# Patient Record
Sex: Female | Born: 1935 | Race: White | Hispanic: No | Marital: Married | Smoking: Former smoker
Health system: Southern US, Community
[De-identification: ages and names within clinical notes are randomized; demographics above are authoritative.]

## PROBLEM LIST (undated history)

## (undated) DIAGNOSIS — H409 Unspecified glaucoma: Secondary | ICD-10-CM

## (undated) DIAGNOSIS — J449 Chronic obstructive pulmonary disease, unspecified: Secondary | ICD-10-CM

## (undated) DIAGNOSIS — G2581 Restless legs syndrome: Secondary | ICD-10-CM

## (undated) HISTORY — PX: APPENDECTOMY: SHX54

## (undated) HISTORY — PX: PNEUMONECTOMY: SHX168

## (undated) HISTORY — PX: TONSILLECTOMY: SUR1361

---

## 2004-05-20 ENCOUNTER — Inpatient Hospital Stay: Payer: Self-pay | Admitting: Internal Medicine

## 2004-05-20 ENCOUNTER — Other Ambulatory Visit: Payer: Self-pay

## 2004-09-26 ENCOUNTER — Ambulatory Visit: Payer: Self-pay | Admitting: Internal Medicine

## 2005-08-15 ENCOUNTER — Emergency Department: Payer: Self-pay | Admitting: Unknown Physician Specialty

## 2005-08-15 ENCOUNTER — Other Ambulatory Visit: Payer: Self-pay

## 2005-10-14 ENCOUNTER — Ambulatory Visit: Payer: Self-pay | Admitting: Internal Medicine

## 2006-10-20 ENCOUNTER — Ambulatory Visit: Payer: Self-pay | Admitting: Internal Medicine

## 2007-10-22 ENCOUNTER — Ambulatory Visit: Payer: Self-pay | Admitting: Internal Medicine

## 2008-10-25 ENCOUNTER — Ambulatory Visit: Payer: Self-pay | Admitting: Internal Medicine

## 2009-10-26 ENCOUNTER — Ambulatory Visit: Payer: Self-pay | Admitting: Internal Medicine

## 2010-01-02 ENCOUNTER — Encounter: Admission: RE | Admit: 2010-01-02 | Discharge: 2010-01-02 | Payer: Self-pay | Admitting: Orthopedic Surgery

## 2010-01-18 ENCOUNTER — Encounter: Admission: RE | Admit: 2010-01-18 | Discharge: 2010-01-18 | Payer: Self-pay | Admitting: Orthopedic Surgery

## 2010-03-01 ENCOUNTER — Encounter
Admission: RE | Admit: 2010-03-01 | Discharge: 2010-03-01 | Payer: Self-pay | Source: Home / Self Care | Attending: Orthopedic Surgery | Admitting: Orthopedic Surgery

## 2010-06-05 ENCOUNTER — Inpatient Hospital Stay: Payer: Self-pay

## 2010-10-29 ENCOUNTER — Ambulatory Visit: Payer: Self-pay | Admitting: Internal Medicine

## 2010-11-06 ENCOUNTER — Ambulatory Visit: Payer: Self-pay | Admitting: Internal Medicine

## 2011-06-04 ENCOUNTER — Encounter: Payer: Self-pay | Admitting: Internal Medicine

## 2011-06-17 ENCOUNTER — Encounter: Payer: Self-pay | Admitting: Internal Medicine

## 2011-07-17 ENCOUNTER — Encounter: Payer: Self-pay | Admitting: Internal Medicine

## 2011-08-17 ENCOUNTER — Encounter: Payer: Self-pay | Admitting: Internal Medicine

## 2011-09-16 ENCOUNTER — Encounter: Payer: Self-pay | Admitting: Internal Medicine

## 2011-11-04 ENCOUNTER — Ambulatory Visit: Payer: Self-pay | Admitting: Internal Medicine

## 2011-11-05 ENCOUNTER — Ambulatory Visit: Payer: Self-pay | Admitting: Internal Medicine

## 2012-11-10 ENCOUNTER — Ambulatory Visit: Payer: Self-pay

## 2013-11-18 ENCOUNTER — Ambulatory Visit: Payer: Self-pay

## 2014-07-04 ENCOUNTER — Ambulatory Visit
Admit: 2014-07-04 | Disposition: A | Discharge status: Home / Self Care | Payer: Self-pay | Attending: Internal Medicine | Admitting: Internal Medicine

## 2014-11-26 ENCOUNTER — Emergency Department: Payer: Medicare Other

## 2014-11-26 ENCOUNTER — Emergency Department
Admission: EM | Admit: 2014-11-26 | Discharge: 2014-11-26 | Disposition: A | Discharge status: Home / Self Care | Payer: Medicare Other | Attending: Emergency Medicine | Admitting: Emergency Medicine

## 2014-11-26 ENCOUNTER — Encounter: Payer: Self-pay | Admitting: *Deleted

## 2014-11-26 DIAGNOSIS — S80211A Abrasion, right knee, initial encounter: Secondary | ICD-10-CM | POA: Diagnosis not present

## 2014-11-26 DIAGNOSIS — S50312A Abrasion of left elbow, initial encounter: Secondary | ICD-10-CM | POA: Diagnosis not present

## 2014-11-26 DIAGNOSIS — S0083XA Contusion of other part of head, initial encounter: Secondary | ICD-10-CM | POA: Diagnosis not present

## 2014-11-26 DIAGNOSIS — Y9389 Activity, other specified: Secondary | ICD-10-CM | POA: Insufficient documentation

## 2014-11-26 DIAGNOSIS — Y998 Other external cause status: Secondary | ICD-10-CM | POA: Insufficient documentation

## 2014-11-26 DIAGNOSIS — Y9289 Other specified places as the place of occurrence of the external cause: Secondary | ICD-10-CM | POA: Diagnosis not present

## 2014-11-26 DIAGNOSIS — S6992XA Unspecified injury of left wrist, hand and finger(s), initial encounter: Secondary | ICD-10-CM | POA: Diagnosis present

## 2014-11-26 DIAGNOSIS — Z87891 Personal history of nicotine dependence: Secondary | ICD-10-CM | POA: Diagnosis not present

## 2014-11-26 DIAGNOSIS — S0990XA Unspecified injury of head, initial encounter: Secondary | ICD-10-CM | POA: Insufficient documentation

## 2014-11-26 DIAGNOSIS — W010XXA Fall on same level from slipping, tripping and stumbling without subsequent striking against object, initial encounter: Secondary | ICD-10-CM | POA: Diagnosis not present

## 2014-11-26 DIAGNOSIS — S52592A Other fractures of lower end of left radius, initial encounter for closed fracture: Secondary | ICD-10-CM | POA: Insufficient documentation

## 2014-11-26 DIAGNOSIS — S52502A Unspecified fracture of the lower end of left radius, initial encounter for closed fracture: Secondary | ICD-10-CM

## 2014-11-26 HISTORY — DX: Chronic obstructive pulmonary disease, unspecified: J44.9

## 2014-11-26 HISTORY — DX: Restless legs syndrome: G25.81

## 2014-11-26 MED ORDER — TRAMADOL HCL 50 MG PO TABS
50.0000 mg | ORAL_TABLET | Freq: Once | ORAL | Status: AC
  Administered 2014-11-26: 50 mg via ORAL
  Filled 2014-11-26: qty 1

## 2014-11-26 MED ORDER — TRAMADOL HCL 50 MG PO TABS: 50.0000 mg | ORAL_TABLET | Freq: Four times a day (QID) | ORAL | Status: AC | PRN

## 2014-11-26 MED ORDER — ACETAMINOPHEN 325 MG PO TABS
650.0000 mg | ORAL_TABLET | Freq: Once | ORAL | Status: AC
  Administered 2014-11-26: 650 mg via ORAL
  Filled 2014-11-26: qty 2

## 2014-11-26 NOTE — ED Provider Notes (Signed)
[facility] Emergency Department Provider Note  Time seen: 1:34 PM  I have reviewed the triage vital signs and the nursing notes.   HISTORY  Chief Complaint Fall    HPI Josie Burleigh is a 81 y.o. female with a past medical history of COPD on 2 L of oxygen at home presents the emergency department after a fall. According to the patient she was getting up when she tripped on the oxygen line causing her to fall forward's. Patient states she caught her self on her hands but her head did hit the ground. Denies loss of consciousness. Denies being on any blood thinners. Patient's only pain complaint are mild left forehead pain, and moderate left wrist pain. Wrist pain is much worse with movement.    Past Medical History  Diagnosis Date  . COPD (chronic obstructive pulmonary disease)   . Restless legs     There are no active problems to display for this patient.   Past Surgical History  Procedure Laterality Date  . Lung removal, partial Right     squamous cell carcinoma, all lobes on the right were removed.  Marland Kitchen Appendectomy    . Tonsillectomy      No current outpatient prescriptions on file.  Allergies Review of patient's allergies indicates no known allergies.  No family history on file.  Social History Social History  Substance Use Topics  . Smoking status: Former Research scientist (life sciences)  . Smokeless tobacco: None  . Alcohol Use: Yes     Comment: occasionally    Review of Systems Constitutional: Negative for fever negative for LOC. Cardiovascular: Negative for chest pain. Respiratory: Negative for shortness of breath. Gastrointestinal: Negative for abdominal pain Musculoskeletal: Positive for left forehead pain. Positive for left wrist pain. Skin: Abrasions/small skin tear to right knee and left elbow Neurological: No headache. Negative for focal weakness or numbness. 10-point ROS otherwise  negative.  ____________________________________________   PHYSICAL EXAM:  VITAL SIGNS: ED Triage Vitals  Enc Vitals Group     BP 11/26/14 1324 191/93 mmHg     Pulse Rate 11/26/14 1324 110     Resp 11/26/14 1324 20     Temp 11/26/14 1324 98.5 F (36.9 C)     Temp Source 11/26/14 1324 Oral     SpO2 11/26/14 1324 92 %     Weight 11/26/14 1324 148 lb 8 oz (67.359 kg)     Height 11/26/14 1324 '5\' 4"'$  (1.626 m)     Head Cir --      Peak Flow --      Pain Score 11/26/14 1325 8     Pain Loc --      Pain Edu? --      Excl. in [place]? --     Constitutional: Alert and oriented. Well appearing and in no distress. Eyes: Normal exam ENT   Head: Moderate hematoma to left forehead.   Nose: No congestion/rhinnorhea.   Mouth/Throat: Mucous membranes are moist. No oral injuries. Cardiovascular: Normal rate, regular rhythm.  Respiratory: Normal respiratory effort without tachypnea nor retractions. Breath sounds are clear  Gastrointestinal: Soft and nontender. No distention.  Musculoskeletal: Moderate tenderness of left wrist, worse with movement. Neurovascularly intact distally. Pelvis stable, good range of motion bilateral hips without tenderness. Neurologic:  Normal speech and language. No gross focal neurologic deficits Skin:  Small skin tear to patient's right knee and left elbow, no repair required. Psychiatric: Mood and affect are normal. Speech and behavior are normal.   ____________________________________________  RADIOLOGY  CT head negative X-ray consistent with left distal radius compaction fracture  ____________________________________________    INITIAL IMPRESSION / ASSESSMENT AND PLAN / ED COURSE  Pertinent labs & imaging results that were available during my care of the patient were reviewed by me and considered in my medical decision making (see chart for details).  Patient presents after a fall. X-ray consistent with left distal radius fracture. CT head  negative. We will splint the left wrist in a volar wrist splint, and have the patient follow-up with a pH. We'll discharge with Ultram which the patient has taken in the past with good response.  ____________________________________________   FINAL CLINICAL IMPRESSION(S) / ED DIAGNOSES  Distal radius fracture Cheral Marker, MD 11/26/14 [id number]

## 2014-11-26 NOTE — ED Notes (Signed)
NAD noted at time of D/C. Pt taken to lobby via wheelchair. Pt denies comments/concerns at this time.

## 2014-11-26 NOTE — Discharge Instructions (Signed)
Wrist Fracture A wrist fracture is a break or crack in one of the bones of your wrist. Your wrist is made up of eight small bones at the palm of your hand (carpal bones) and two long bones that make up your forearm (radius and ulna). The goal of treatment is to hold the injured bone in place while it heals. Surgery may or may not be needed to care for your injured wrist.  HOME CARE  Keep your injured wrist raised (elevated). Move your fingers as much as you can.  Do not put pressure on any part of your cast or splint. It may break.  Use a plastic bag to protect your cast or splint from water while bathing or showering. Do not lower your cast or splint into water.  Take medicines only as told by your doctor.  Keep your cast or splint clean and dry. If it gets wet, damaged, or suddenly feels too tight, tell your doctor right away.  Do not use any tobacco products including cigarettes, chewing tobacco, or electronic cigarettes. Tobacco can slow bone healing. If you need help quitting, ask your doctor.  Keep all follow-up visits as told by your doctor. This is important.  Ask your doctor if you should take supplements of calcium and vitamins C and D. GET HELP IF:   Your cast or splint is damaged, breaks, or gets wet.  You have a fever.  You have chills.  You have very bad pain that does not go away.  You have more swelling (inflammation) than before the cast was put on. GET HELP RIGHT AWAY IF:   Your hand or fingernails on the injured arm turn blue or gray, or feel cold or numb.  You lose some feeling in the fingers of your injured arm. MAKE SURE YOU:   Understand these instructions.  Will watch your condition.  Will get help right away if you are not doing well or get worse. Document Released: 08/21/2007 Document Revised: 07/19/2013 Document Reviewed: 09/16/2011 [facility] Patient Information 2015 [place], [place]. This information is not intended to replace advice given to you  by your health care provider. Make sure you discuss any questions you have with your health care provider.

## 2014-11-26 NOTE — ED Notes (Signed)
Per EMS and patient's report, patient tripped over O2 tubing on O2 concentrator. This was unwitnessed, though husband heard the fall and came to her. Patient denies LOC, denies being on blood thinners. Patient c/o pain in left wrist. Patient has hematoma over left eye, skin tear on right knee and left elbow. Left wrist is more painful with movement. Patient is calm, pleasant. Patient lives at [facility] with her husband.

## 2014-11-28 ENCOUNTER — Emergency Department
Admission: EM | Admit: 2014-11-28 | Discharge: 2014-11-28 | Disposition: A | Discharge status: Home / Self Care | Payer: Medicare Other | Attending: Emergency Medicine | Admitting: Emergency Medicine

## 2014-11-28 ENCOUNTER — Encounter: Payer: Self-pay | Admitting: Emergency Medicine

## 2014-11-28 DIAGNOSIS — Y998 Other external cause status: Secondary | ICD-10-CM | POA: Diagnosis not present

## 2014-11-28 DIAGNOSIS — S59912A Unspecified injury of left forearm, initial encounter: Secondary | ICD-10-CM | POA: Diagnosis present

## 2014-11-28 DIAGNOSIS — S0083XA Contusion of other part of head, initial encounter: Secondary | ICD-10-CM | POA: Insufficient documentation

## 2014-11-28 DIAGNOSIS — W1839XA Other fall on same level, initial encounter: Secondary | ICD-10-CM | POA: Insufficient documentation

## 2014-11-28 DIAGNOSIS — R58 Hemorrhage, not elsewhere classified: Secondary | ICD-10-CM

## 2014-11-28 DIAGNOSIS — Z87891 Personal history of nicotine dependence: Secondary | ICD-10-CM | POA: Insufficient documentation

## 2014-11-28 DIAGNOSIS — Z79899 Other long term (current) drug therapy: Secondary | ICD-10-CM | POA: Diagnosis not present

## 2014-11-28 DIAGNOSIS — Y9389 Activity, other specified: Secondary | ICD-10-CM | POA: Insufficient documentation

## 2014-11-28 DIAGNOSIS — S52502D Unspecified fracture of the lower end of left radius, subsequent encounter for closed fracture with routine healing: Secondary | ICD-10-CM | POA: Diagnosis not present

## 2014-11-28 DIAGNOSIS — Z7951 Long term (current) use of inhaled steroids: Secondary | ICD-10-CM | POA: Diagnosis not present

## 2014-11-28 DIAGNOSIS — Y9289 Other specified places as the place of occurrence of the external cause: Secondary | ICD-10-CM | POA: Diagnosis not present

## 2014-11-28 DIAGNOSIS — S5012XA Contusion of left forearm, initial encounter: Secondary | ICD-10-CM | POA: Insufficient documentation

## 2014-11-28 MED ORDER — ACETAMINOPHEN-CODEINE #3 300-30 MG PO TABS: 1.0000 | ORAL_TABLET | Freq: Four times a day (QID) | ORAL | Status: DC | PRN

## 2014-11-28 NOTE — ED Notes (Signed)
Pt reports falling on Sat. And being seen in ED and dx with left arm break. Pt reports Ortho MD did not have time to see pt regarding arm. Left arm and hand began to swell yesterday and pt has large bruises on arm wrist and hands. Pt is able to move hand and fingers. Sensation is intact and circulation is intact. Pt also has bruising around eyes and on face but reports no complications other than tenderness since Saturday.

## 2014-11-28 NOTE — ED Notes (Signed)
Was seen on sat s/p post . Is having increased pain to left forearm  And swelling. Has appt with ortho on weds

## 2014-11-28 NOTE — ED Provider Notes (Signed)
[facility] Emergency Department Provider Note  ____________________________________________  Time seen: Approximately 932 PM  I have reviewed the triage vital signs and the nursing notes.   HISTORY  Chief Complaint Fall    HPI Kristie [name] is a 81 y.o. female who had a fractureof the left distal radius on September 10 who is presenting today for increased swelling and discoloration of her fingers to left hand. She also says that she has been having little success with the tramadol and is requesting a different pain medication. She says that she has tolerated Tylenol 3 well past. She is able to move her fingers. No numbness reported.   Past Medical History  Diagnosis Date  . COPD (chronic obstructive pulmonary disease)   . Restless legs     There are no active problems to display for this patient.   Past Surgical History  Procedure Laterality Date  . Lung removal, partial Right     squamous cell carcinoma, all lobes on the right were removed.  Marland Kitchen Appendectomy    . Tonsillectomy      Current Outpatient Rx  Name  Route  Sig  Dispense  Refill  . BREO ELLIPTA 100-25 MCG/INH AEPB   Inhalation   Inhale 1 puff into the lungs every morning.      0     Dispense as written.   . chlorpheniramine (CHLOR-TRIMETON) 4 MG tablet   Oral   Take 4 mg by mouth every evening.         . fluticasone (FLONASE) 50 MCG/ACT nasal spray   Each Nare   Place 1 spray into both nostrils daily as needed for allergies or rhinitis.         Marland Kitchen gabapentin (NEURONTIN) 300 MG capsule   Oral   Take 300 mg by mouth every evening.      11   . latanoprost (XALATAN) 0.005 % ophthalmic solution   Both Eyes   Place 1 drop into both eyes every evening.      3   . Magnesium 250 MG TABS   Oral   Take 250 mg by mouth daily with breakfast.         . Multiple Vitamin (MULTI VITAMIN DAILY PO)   Oral   Take 1 tablet by mouth daily.         Marland Kitchen rOPINIRole (REQUIP) 1 MG  tablet   Oral   Take 1 mg by mouth at bedtime.      3   . SPIRIVA HANDIHALER 18 MCG inhalation capsule   Inhalation   Place 18 mcg into inhaler and inhale every evening.      5     Dispense as written.   . traMADol (ULTRAM) 50 MG tablet   Oral   Take 1 tablet (50 mg total) by mouth every 6 (six) hours as needed.   20 tablet   0     Allergies Review of patient's allergies indicates no known allergies.  No family history on file.  Social History Social History  Substance Use Topics  . Smoking status: Former Research scientist (life sciences)  . Smokeless tobacco: None  . Alcohol Use: Yes     Comment: occasionally    Review of Systems Constitutional: No fever/chills Eyes: No visual changes. ENT: No sore throat. Cardiovascular: Denies chest pain. Respiratory: Denies shortness of breath. Gastrointestinal: No abdominal pain.  No nausea, no vomiting.  No diarrhea.  No constipation. Genitourinary: Negative for dysuria. Musculoskeletal: Negative for back pain. Skin: Negative  for rash. Neurological: Negative for headaches, focal weakness or numbness.  10-point ROS otherwise negative.  ____________________________________________   PHYSICAL EXAM:  VITAL SIGNS: ED Triage Vitals  Enc Vitals Group     BP 11/28/14 1727 147/50 mmHg     Pulse Rate 11/28/14 1727 90     Resp 11/28/14 1727 16     Temp 11/28/14 1727 98.1 F (36.7 C)     Temp Source 11/28/14 1727 Oral     SpO2 11/28/14 1727 92 %     Weight 11/28/14 1727 140 lb (63.504 kg)     Height 11/28/14 1727 '5\' 4"'$  (1.626 m)     Head Cir --      Peak Flow --      Pain Score 11/28/14 1731 8     Pain Loc --      Pain Edu? --      Excl. in [place]? --     Constitutional: Alert and oriented. Well appearing and in no acute distress. Eyes: Conjunctivae are normal. PERRL. EOMI. Head: Ecchymosis over the bilateral cheeks as well as the left periorbit. Nose: No congestion/rhinnorhea. Neck: No stridor.   Cardiovascular: Normal rate, regular  rhythm. Grossly normal heart sounds.  Good peripheral circulation. Respiratory: Normal respiratory effort.  No retractions. Lungs CTAB. Gastrointestinal: Soft and nontender. No distention.  Musculoskeletal: Left upper extremity with ecchymosis and mild edema to the wrist extending to the fingers. The compartments are soft and only with minimal tenderness to palpation. She is able to range her fingers. She has a brisk capillary refill which is less than one second. She has a 1+ radial pulse. There is swelling over the distal radius without obvious deformity. Neurologic:  Normal speech and language. No gross focal neurologic deficits are appreciated.  Skin:  Skin is warm, dry and intact. Psychiatric: Mood and affect are normal. Speech and behavior are normal.  ____________________________________________   LABS (all labs ordered are listed, but only abnormal results are displayed)  Labs Reviewed - No data to display ____________________________________________  EKG   ____________________________________________  RADIOLOGY   ____________________________________________   PROCEDURES    ____________________________________________   INITIAL IMPRESSION / ASSESSMENT AND PLAN / ED COURSE  Pertinent labs & imaging results that were available during my care of the patient were reviewed by me and considered in my medical decision making (see chart for details).  Should the patient as well as her husband that this is the normal progression of her fracture. It is normal for the body to begin breaking down the blood that was spilled from the broken bone and this purplish appearance is part of the stages of the blood breakdown and bruising. I advised them that it was likely continue to change: Likely lighten to an orangeish yellow eventually before it clears. We did discuss reasons to return such as if the splint is causing extreme pain or tightness. The splint was re-wrapped loosely and the  patient continues to be neurovascularly intact. We discussed keeping the arm elevated on pillows as well as using ice in a plastic bag wrapped with a paper towel to prevent the splint from getting wet.  ____________________________________________   FINAL CLINICAL IMPRESSION(S) / ED DIAGNOSES  Acute ecchymosis status post fracture to the left forearm and hand. Initial visit.    [name], MD 11/28/14 [phone]

## 2014-11-28 NOTE — ED Notes (Signed)
Patients arm was rewrapped with same ace bandage and splinting material.

## 2015-03-21 DIAGNOSIS — S52532D Colles' fracture of left radius, subsequent encounter for closed fracture with routine healing: Secondary | ICD-10-CM | POA: Diagnosis not present

## 2015-03-21 DIAGNOSIS — M25532 Pain in left wrist: Secondary | ICD-10-CM | POA: Diagnosis not present

## 2015-03-21 DIAGNOSIS — M6281 Muscle weakness (generalized): Secondary | ICD-10-CM | POA: Diagnosis not present

## 2015-03-23 DIAGNOSIS — S52532D Colles' fracture of left radius, subsequent encounter for closed fracture with routine healing: Secondary | ICD-10-CM | POA: Diagnosis not present

## 2015-03-23 DIAGNOSIS — M25532 Pain in left wrist: Secondary | ICD-10-CM | POA: Diagnosis not present

## 2015-03-23 DIAGNOSIS — M6281 Muscle weakness (generalized): Secondary | ICD-10-CM | POA: Diagnosis not present

## 2015-03-24 DIAGNOSIS — M25532 Pain in left wrist: Secondary | ICD-10-CM | POA: Diagnosis not present

## 2015-03-24 DIAGNOSIS — M6281 Muscle weakness (generalized): Secondary | ICD-10-CM | POA: Diagnosis not present

## 2015-03-24 DIAGNOSIS — S52532D Colles' fracture of left radius, subsequent encounter for closed fracture with routine healing: Secondary | ICD-10-CM | POA: Diagnosis not present

## 2015-03-29 DIAGNOSIS — M6281 Muscle weakness (generalized): Secondary | ICD-10-CM | POA: Diagnosis not present

## 2015-03-29 DIAGNOSIS — S52532D Colles' fracture of left radius, subsequent encounter for closed fracture with routine healing: Secondary | ICD-10-CM | POA: Diagnosis not present

## 2015-03-29 DIAGNOSIS — M25532 Pain in left wrist: Secondary | ICD-10-CM | POA: Diagnosis not present

## 2015-03-30 DIAGNOSIS — M6281 Muscle weakness (generalized): Secondary | ICD-10-CM | POA: Diagnosis not present

## 2015-03-30 DIAGNOSIS — M25532 Pain in left wrist: Secondary | ICD-10-CM | POA: Diagnosis not present

## 2015-03-30 DIAGNOSIS — S52532D Colles' fracture of left radius, subsequent encounter for closed fracture with routine healing: Secondary | ICD-10-CM | POA: Diagnosis not present

## 2015-03-31 DIAGNOSIS — S52532D Colles' fracture of left radius, subsequent encounter for closed fracture with routine healing: Secondary | ICD-10-CM | POA: Diagnosis not present

## 2015-03-31 DIAGNOSIS — M6281 Muscle weakness (generalized): Secondary | ICD-10-CM | POA: Diagnosis not present

## 2015-03-31 DIAGNOSIS — M25532 Pain in left wrist: Secondary | ICD-10-CM | POA: Diagnosis not present

## 2015-04-03 DIAGNOSIS — M25532 Pain in left wrist: Secondary | ICD-10-CM | POA: Diagnosis not present

## 2015-04-03 DIAGNOSIS — S52532D Colles' fracture of left radius, subsequent encounter for closed fracture with routine healing: Secondary | ICD-10-CM | POA: Diagnosis not present

## 2015-04-03 DIAGNOSIS — M6281 Muscle weakness (generalized): Secondary | ICD-10-CM | POA: Diagnosis not present

## 2015-04-04 DIAGNOSIS — M6281 Muscle weakness (generalized): Secondary | ICD-10-CM | POA: Diagnosis not present

## 2015-04-04 DIAGNOSIS — S52532D Colles' fracture of left radius, subsequent encounter for closed fracture with routine healing: Secondary | ICD-10-CM | POA: Diagnosis not present

## 2015-04-04 DIAGNOSIS — M25532 Pain in left wrist: Secondary | ICD-10-CM | POA: Diagnosis not present

## 2015-04-06 DIAGNOSIS — S52532D Colles' fracture of left radius, subsequent encounter for closed fracture with routine healing: Secondary | ICD-10-CM | POA: Diagnosis not present

## 2015-04-06 DIAGNOSIS — M6281 Muscle weakness (generalized): Secondary | ICD-10-CM | POA: Diagnosis not present

## 2015-04-06 DIAGNOSIS — M25532 Pain in left wrist: Secondary | ICD-10-CM | POA: Diagnosis not present

## 2015-04-07 DIAGNOSIS — M810 Age-related osteoporosis without current pathological fracture: Secondary | ICD-10-CM | POA: Diagnosis not present

## 2015-04-07 DIAGNOSIS — J439 Emphysema, unspecified: Secondary | ICD-10-CM | POA: Diagnosis not present

## 2015-04-07 DIAGNOSIS — R600 Localized edema: Secondary | ICD-10-CM | POA: Diagnosis not present

## 2015-04-07 DIAGNOSIS — S52541S Smith's fracture of right radius, sequela: Secondary | ICD-10-CM | POA: Diagnosis not present

## 2015-04-10 DIAGNOSIS — M6281 Muscle weakness (generalized): Secondary | ICD-10-CM | POA: Diagnosis not present

## 2015-04-10 DIAGNOSIS — M25532 Pain in left wrist: Secondary | ICD-10-CM | POA: Diagnosis not present

## 2015-04-10 DIAGNOSIS — S52532D Colles' fracture of left radius, subsequent encounter for closed fracture with routine healing: Secondary | ICD-10-CM | POA: Diagnosis not present

## 2015-04-12 DIAGNOSIS — M25532 Pain in left wrist: Secondary | ICD-10-CM | POA: Diagnosis not present

## 2015-04-12 DIAGNOSIS — M6281 Muscle weakness (generalized): Secondary | ICD-10-CM | POA: Diagnosis not present

## 2015-04-12 DIAGNOSIS — S52532D Colles' fracture of left radius, subsequent encounter for closed fracture with routine healing: Secondary | ICD-10-CM | POA: Diagnosis not present

## 2015-04-13 DIAGNOSIS — S52532D Colles' fracture of left radius, subsequent encounter for closed fracture with routine healing: Secondary | ICD-10-CM | POA: Diagnosis not present

## 2015-04-13 DIAGNOSIS — M25532 Pain in left wrist: Secondary | ICD-10-CM | POA: Diagnosis not present

## 2015-04-13 DIAGNOSIS — M6281 Muscle weakness (generalized): Secondary | ICD-10-CM | POA: Diagnosis not present

## 2015-04-14 DIAGNOSIS — J432 Centrilobular emphysema: Secondary | ICD-10-CM | POA: Diagnosis not present

## 2015-04-14 DIAGNOSIS — G2581 Restless legs syndrome: Secondary | ICD-10-CM | POA: Diagnosis not present

## 2015-04-14 DIAGNOSIS — J9611 Chronic respiratory failure with hypoxia: Secondary | ICD-10-CM | POA: Diagnosis not present

## 2015-04-14 DIAGNOSIS — R600 Localized edema: Secondary | ICD-10-CM | POA: Diagnosis not present

## 2015-04-14 DIAGNOSIS — Z85118 Personal history of other malignant neoplasm of bronchus and lung: Secondary | ICD-10-CM | POA: Diagnosis not present

## 2015-04-16 DIAGNOSIS — Z23 Encounter for immunization: Secondary | ICD-10-CM | POA: Diagnosis not present

## 2015-04-17 DIAGNOSIS — S52532D Colles' fracture of left radius, subsequent encounter for closed fracture with routine healing: Secondary | ICD-10-CM | POA: Diagnosis not present

## 2015-04-17 DIAGNOSIS — M25532 Pain in left wrist: Secondary | ICD-10-CM | POA: Diagnosis not present

## 2015-04-17 DIAGNOSIS — M6281 Muscle weakness (generalized): Secondary | ICD-10-CM | POA: Diagnosis not present

## 2015-04-19 DIAGNOSIS — M6281 Muscle weakness (generalized): Secondary | ICD-10-CM | POA: Diagnosis not present

## 2015-04-19 DIAGNOSIS — M25532 Pain in left wrist: Secondary | ICD-10-CM | POA: Diagnosis not present

## 2015-04-21 DIAGNOSIS — M25532 Pain in left wrist: Secondary | ICD-10-CM | POA: Diagnosis not present

## 2015-04-21 DIAGNOSIS — M6281 Muscle weakness (generalized): Secondary | ICD-10-CM | POA: Diagnosis not present

## 2015-04-24 DIAGNOSIS — M6281 Muscle weakness (generalized): Secondary | ICD-10-CM | POA: Diagnosis not present

## 2015-04-24 DIAGNOSIS — M25532 Pain in left wrist: Secondary | ICD-10-CM | POA: Diagnosis not present

## 2015-04-26 DIAGNOSIS — M6281 Muscle weakness (generalized): Secondary | ICD-10-CM | POA: Diagnosis not present

## 2015-04-26 DIAGNOSIS — M25532 Pain in left wrist: Secondary | ICD-10-CM | POA: Diagnosis not present

## 2015-04-28 DIAGNOSIS — M6281 Muscle weakness (generalized): Secondary | ICD-10-CM | POA: Diagnosis not present

## 2015-04-28 DIAGNOSIS — M25532 Pain in left wrist: Secondary | ICD-10-CM | POA: Diagnosis not present

## 2015-04-28 DIAGNOSIS — H401131 Primary open-angle glaucoma, bilateral, mild stage: Secondary | ICD-10-CM | POA: Diagnosis not present

## 2015-05-11 DIAGNOSIS — M25532 Pain in left wrist: Secondary | ICD-10-CM | POA: Diagnosis not present

## 2015-05-11 DIAGNOSIS — M6281 Muscle weakness (generalized): Secondary | ICD-10-CM | POA: Diagnosis not present

## 2015-06-20 DIAGNOSIS — J449 Chronic obstructive pulmonary disease, unspecified: Secondary | ICD-10-CM | POA: Diagnosis not present

## 2015-06-20 DIAGNOSIS — R0602 Shortness of breath: Secondary | ICD-10-CM | POA: Diagnosis not present

## 2015-06-20 DIAGNOSIS — R05 Cough: Secondary | ICD-10-CM | POA: Diagnosis not present

## 2015-06-20 DIAGNOSIS — J9611 Chronic respiratory failure with hypoxia: Secondary | ICD-10-CM | POA: Diagnosis not present

## 2015-08-17 DIAGNOSIS — J9611 Chronic respiratory failure with hypoxia: Secondary | ICD-10-CM | POA: Diagnosis not present

## 2015-08-17 DIAGNOSIS — J449 Chronic obstructive pulmonary disease, unspecified: Secondary | ICD-10-CM | POA: Diagnosis not present

## 2015-09-26 DIAGNOSIS — H6123 Impacted cerumen, bilateral: Secondary | ICD-10-CM | POA: Diagnosis not present

## 2015-09-26 DIAGNOSIS — H6061 Unspecified chronic otitis externa, right ear: Secondary | ICD-10-CM | POA: Diagnosis not present

## 2015-09-26 DIAGNOSIS — R040 Epistaxis: Secondary | ICD-10-CM | POA: Diagnosis not present

## 2015-10-16 DIAGNOSIS — G2581 Restless legs syndrome: Secondary | ICD-10-CM | POA: Diagnosis not present

## 2015-10-16 DIAGNOSIS — J9611 Chronic respiratory failure with hypoxia: Secondary | ICD-10-CM | POA: Diagnosis not present

## 2015-10-16 DIAGNOSIS — J432 Centrilobular emphysema: Secondary | ICD-10-CM | POA: Diagnosis not present

## 2015-10-16 DIAGNOSIS — R600 Localized edema: Secondary | ICD-10-CM | POA: Diagnosis not present

## 2015-10-23 DIAGNOSIS — M810 Age-related osteoporosis without current pathological fracture: Secondary | ICD-10-CM | POA: Diagnosis not present

## 2015-10-23 DIAGNOSIS — J9611 Chronic respiratory failure with hypoxia: Secondary | ICD-10-CM | POA: Diagnosis not present

## 2015-10-23 DIAGNOSIS — G2581 Restless legs syndrome: Secondary | ICD-10-CM | POA: Diagnosis not present

## 2015-10-23 DIAGNOSIS — J432 Centrilobular emphysema: Secondary | ICD-10-CM | POA: Diagnosis not present

## 2015-10-26 DIAGNOSIS — H26493 Other secondary cataract, bilateral: Secondary | ICD-10-CM | POA: Diagnosis not present

## 2015-12-26 DIAGNOSIS — J449 Chronic obstructive pulmonary disease, unspecified: Secondary | ICD-10-CM | POA: Diagnosis not present

## 2015-12-26 DIAGNOSIS — J9611 Chronic respiratory failure with hypoxia: Secondary | ICD-10-CM | POA: Diagnosis not present

## 2015-12-26 DIAGNOSIS — Z23 Encounter for immunization: Secondary | ICD-10-CM | POA: Diagnosis not present

## 2016-01-13 DIAGNOSIS — Z23 Encounter for immunization: Secondary | ICD-10-CM | POA: Diagnosis not present

## 2016-01-31 DIAGNOSIS — R0602 Shortness of breath: Secondary | ICD-10-CM | POA: Diagnosis not present

## 2016-02-12 ENCOUNTER — Inpatient Hospital Stay
Admission: EM | Admit: 2016-02-12 | Discharge: 2016-02-15 | DRG: 190 | Disposition: A | Discharge status: Home Health | Payer: Medicare Other | Attending: Internal Medicine | Admitting: Internal Medicine

## 2016-02-12 ENCOUNTER — Emergency Department: Payer: Medicare Other

## 2016-02-12 ENCOUNTER — Encounter: Payer: Self-pay | Admitting: Emergency Medicine

## 2016-02-12 DIAGNOSIS — R000 Tachycardia, unspecified: Secondary | ICD-10-CM | POA: Diagnosis present

## 2016-02-12 DIAGNOSIS — G2581 Restless legs syndrome: Secondary | ICD-10-CM | POA: Diagnosis not present

## 2016-02-12 DIAGNOSIS — H409 Unspecified glaucoma: Secondary | ICD-10-CM | POA: Diagnosis present

## 2016-02-12 DIAGNOSIS — Z85118 Personal history of other malignant neoplasm of bronchus and lung: Secondary | ICD-10-CM

## 2016-02-12 DIAGNOSIS — Z9981 Dependence on supplemental oxygen: Secondary | ICD-10-CM

## 2016-02-12 DIAGNOSIS — Z791 Long term (current) use of non-steroidal anti-inflammatories (NSAID): Secondary | ICD-10-CM | POA: Diagnosis not present

## 2016-02-12 DIAGNOSIS — M6281 Muscle weakness (generalized): Secondary | ICD-10-CM

## 2016-02-12 DIAGNOSIS — R0600 Dyspnea, unspecified: Secondary | ICD-10-CM | POA: Diagnosis not present

## 2016-02-12 DIAGNOSIS — Z87891 Personal history of nicotine dependence: Secondary | ICD-10-CM | POA: Diagnosis not present

## 2016-02-12 DIAGNOSIS — Z8052 Family history of malignant neoplasm of bladder: Secondary | ICD-10-CM | POA: Diagnosis not present

## 2016-02-12 DIAGNOSIS — J441 Chronic obstructive pulmonary disease with (acute) exacerbation: Principal | ICD-10-CM | POA: Diagnosis present

## 2016-02-12 DIAGNOSIS — Z79899 Other long term (current) drug therapy: Secondary | ICD-10-CM | POA: Diagnosis not present

## 2016-02-12 DIAGNOSIS — Z8249 Family history of ischemic heart disease and other diseases of the circulatory system: Secondary | ICD-10-CM

## 2016-02-12 DIAGNOSIS — J9621 Acute and chronic respiratory failure with hypoxia: Secondary | ICD-10-CM | POA: Diagnosis not present

## 2016-02-12 DIAGNOSIS — R0602 Shortness of breath: Secondary | ICD-10-CM | POA: Diagnosis not present

## 2016-02-12 DIAGNOSIS — Z7951 Long term (current) use of inhaled steroids: Secondary | ICD-10-CM

## 2016-02-12 DIAGNOSIS — Z66 Do not resuscitate: Secondary | ICD-10-CM | POA: Diagnosis present

## 2016-02-12 HISTORY — DX: Unspecified glaucoma: H40.9

## 2016-02-12 LAB — COMPREHENSIVE METABOLIC PANEL
ALBUMIN: 3.5 g/dL (ref 3.5–5.0)
ALK PHOS: 77 U/L (ref 38–126)
ALT: 17 U/L (ref 14–54)
ANION GAP: 11 (ref 5–15)
AST: 24 U/L (ref 15–41)
BILIRUBIN TOTAL: 1.2 mg/dL (ref 0.3–1.2)
BUN: 14 mg/dL (ref 6–20)
CALCIUM: 8.6 mg/dL — AB (ref 8.9–10.3)
CO2: 27 mmol/L (ref 22–32)
CREATININE: 0.54 mg/dL (ref 0.44–1.00)
Chloride: 98 mmol/L — ABNORMAL LOW (ref 101–111)
GFR calc non Af Amer: >60 mL/min (ref >60)
GLUCOSE: 144 mg/dL — AB (ref 65–99)
Potassium: 3.3 mmol/L — ABNORMAL LOW (ref 3.5–5.1)
SODIUM: 136 mmol/L (ref 135–145)
TOTAL PROTEIN: 6.7 g/dL (ref 6.5–8.1)

## 2016-02-12 LAB — TROPONIN I: Troponin I: <0.03 ng/mL (ref <0.03)

## 2016-02-12 LAB — CBC WITH DIFFERENTIAL/PLATELET
Basophils Absolute: 0 10*3/uL (ref 0–0.1)
Basophils Relative: 0 %
EOS ABS: 0 10*3/uL (ref 0–0.7)
Eosinophils Relative: 0 %
HEMATOCRIT: 42.6 % (ref 35.0–47.0)
HEMOGLOBIN: 14.6 g/dL (ref 12.0–16.0)
LYMPHS ABS: 0.8 10*3/uL — AB (ref 1.0–3.6)
Lymphocytes Relative: 6 %
MCH: 30.6 pg (ref 26.0–34.0)
MCHC: 34.3 g/dL (ref 32.0–36.0)
MCV: 89.3 fL (ref 80.0–100.0)
MONOS PCT: 9 %
Monocytes Absolute: 1.3 10*3/uL — ABNORMAL HIGH (ref 0.2–0.9)
NEUTROS ABS: 11.8 10*3/uL — AB (ref 1.4–6.5)
NEUTROS PCT: 85 %
Platelets: 190 10*3/uL (ref 150–440)
RBC: 4.78 MIL/uL (ref 3.80–5.20)
RDW: 14.4 % (ref 11.5–14.5)
WBC: 14 10*3/uL — AB (ref 3.6–11.0)

## 2016-02-12 MED ORDER — ALBUTEROL SULFATE (2.5 MG/3ML) 0.083% IN NEBU
5.0000 mg | INHALATION_SOLUTION | Freq: Once | RESPIRATORY_TRACT | Status: AC
  Administered 2016-02-12: 5 mg via RESPIRATORY_TRACT
  Filled 2016-02-12: qty 6

## 2016-02-12 MED ORDER — IPRATROPIUM-ALBUTEROL 0.5-2.5 (3) MG/3ML IN SOLN
3.0000 mL | RESPIRATORY_TRACT | Status: DC
  Administered 2016-02-12 – 2016-02-13 (×7): 3 mL via RESPIRATORY_TRACT
  Filled 2016-02-12 (×6): qty 3

## 2016-02-12 MED ORDER — GABAPENTIN 300 MG PO CAPS
300.0000 mg | ORAL_CAPSULE | Freq: Every evening | ORAL | Status: DC
  Administered 2016-02-12 – 2016-02-14 (×3): 300 mg via ORAL
  Filled 2016-02-12 (×4): qty 1

## 2016-02-12 MED ORDER — ROPINIROLE HCL 1 MG PO TABS
1.0000 mg | ORAL_TABLET | Freq: Every day | ORAL | Status: DC
  Administered 2016-02-12 – 2016-02-14 (×3): 1 mg via ORAL
  Filled 2016-02-12 (×3): qty 1

## 2016-02-12 MED ORDER — LEVOFLOXACIN IN D5W 500 MG/100ML IV SOLN
500.0000 mg | Freq: Once | INTRAVENOUS | Status: AC
  Administered 2016-02-12: 500 mg via INTRAVENOUS
  Filled 2016-02-12: qty 100

## 2016-02-12 MED ORDER — METHYLPREDNISOLONE SODIUM SUCC 125 MG IJ SOLR
125.0000 mg | Freq: Once | INTRAMUSCULAR | Status: AC
  Administered 2016-02-12: 125 mg via INTRAVENOUS
  Filled 2016-02-12: qty 2

## 2016-02-12 MED ORDER — TIOTROPIUM BROMIDE MONOHYDRATE 18 MCG IN CAPS
18.0000 ug | ORAL_CAPSULE | Freq: Every evening | RESPIRATORY_TRACT | Status: DC
Start: 2016-02-12 — End: 2016-02-15
  Administered 2016-02-12 – 2016-02-14 (×3): 18 ug via RESPIRATORY_TRACT
  Filled 2016-02-12: qty 5

## 2016-02-12 MED ORDER — ACETAMINOPHEN-CODEINE #3 300-30 MG PO TABS: 1.0000 | ORAL_TABLET | Freq: Four times a day (QID) | ORAL | Status: DC | PRN

## 2016-02-12 MED ORDER — LATANOPROST 0.005 % OP SOLN
1.0000 [drp] | Freq: Every evening | OPHTHALMIC | Status: DC
Start: 2016-02-12 — End: 2016-02-15
  Administered 2016-02-12 – 2016-02-14 (×3): 1 [drp] via OPHTHALMIC
  Filled 2016-02-12: qty 2.5

## 2016-02-12 MED ORDER — ENOXAPARIN SODIUM 40 MG/0.4ML ~~LOC~~ SOLN
40.0000 mg | SUBCUTANEOUS | Status: DC
  Administered 2016-02-12 – 2016-02-14 (×3): 40 mg via SUBCUTANEOUS
  Filled 2016-02-12 (×2): qty 0.4

## 2016-02-12 MED ORDER — IPRATROPIUM-ALBUTEROL 0.5-2.5 (3) MG/3ML IN SOLN
3.0000 mL | Freq: Once | RESPIRATORY_TRACT | Status: AC
  Administered 2016-02-12: 3 mL via RESPIRATORY_TRACT
  Filled 2016-02-12: qty 3

## 2016-02-12 MED ORDER — METHYLPREDNISOLONE SODIUM SUCC 125 MG IJ SOLR
60.0000 mg | Freq: Four times a day (QID) | INTRAMUSCULAR | Status: DC
  Administered 2016-02-12 – 2016-02-13 (×4): 60 mg via INTRAVENOUS
  Filled 2016-02-12 (×5): qty 2

## 2016-02-12 NOTE — ED Notes (Signed)
Patient transported to X-ray 

## 2016-02-12 NOTE — ED Notes (Signed)
Resumed care from [place], [place]. Pt finished with nebulizer treatment. Pt resting and awaiting bed assignment.

## 2016-02-12 NOTE — ED Provider Notes (Signed)
[facility] Emergency Department Provider Note  ____________________________________________  Time seen: Approximately 11:52 AM  I have reviewed the triage vital signs and the nursing notes.   HISTORY  Chief Complaint Shortness of Breath    HPI Kristie [name] is a 82 y.o. female who complains of shortness of breath and productive cough over the past 3 [name], Kristie [name] and 95 or 96%. Today it was 82% on 2 L nasal cannula. She has COPD and a history of right pneumonectomy for lung cancer. Denies any chest pain. No lower extremity edema or unilateral swelling.     Past Medical History:  Diagnosis Date  . COPD (chronic obstructive pulmonary disease) ([place])   . Restless legs      There are no active problems to display for this patient.    Past Surgical History:  Procedure Laterality Date  . APPENDECTOMY    . LUNG REMOVAL, PARTIAL Right    squamous cell carcinoma, all lobes on the right were removed.  . TONSILLECTOMY       Prior to Admission medications   Medication Sig Start Date End Date Taking? Authorizing Provider  BREO ELLIPTA 100-25 MCG/INH AEPB Inhale 1 puff into the lungs every morning. 11/22/14  Yes Historical Provider, MD  chlorpheniramine (CHLOR-TRIMETON) 4 MG tablet Take 4 mg by mouth every evening.   Yes Historical Provider, MD  gabapentin (NEURONTIN) 300 MG capsule Take 300 mg by mouth every evening. 11/05/14  Yes Historical Provider, MD  latanoprost (XALATAN) 0.005 % ophthalmic solution Place 1 drop into both eyes every evening. 10/01/14  Yes Historical Provider, MD  Magnesium 250 MG TABS Take 250 mg by mouth daily with breakfast.   Yes Historical Provider, MD  Multiple Vitamin (MULTI VITAMIN DAILY PO) Take 1 tablet by mouth daily.   Yes Historical Provider, MD  naproxen sodium (ALEVE) 220 MG tablet Take 220 mg by mouth 2 (two) times daily with a  meal.   Yes Historical Provider, MD  rOPINIRole (REQUIP) 1 MG tablet Take 1 mg by mouth at bedtime. 09/29/14  Yes Historical Provider, MD  SPIRIVA HANDIHALER 18 MCG inhalation capsule Place 18 mcg into inhaler and inhale every evening. 11/13/14  Yes Historical Provider, MD  acetaminophen-codeine (TYLENOL #3) 300-30 MG per tablet Take 1 tablet by mouth every 6 (six) hours as needed for moderate pain. Patient not taking: Reported on 02/12/2016 11/28/14   [name], MD     Allergies Patient has no known allergies.   History reviewed. No pertinent family history.  Social History Social History  Substance Use Topics  . Smoking status: Former Research scientist (life sciences)  . Smokeless tobacco: Not on file  . Alcohol use Yes     Comment: occasionally    Review of Systems  Constitutional:   No fever or chills.  ENT:   No sore throat. No rhinorrhea. Cardiovascular:   No chest pain. Respiratory:   Positive shortness of breath and productive cough. Gastrointestinal:   Negative for abdominal pain, vomiting and diarrhea.  Genitourinary:   Negative for dysuria or difficulty urinating. Musculoskeletal:   Negative for focal pain or swelling Neurological:   Negative for headaches 10-point ROS otherwise negative.  ____________________________________________   PHYSICAL EXAM:  VITAL SIGNS: ED Triage Vitals  Enc Vitals Group     BP 02/12/16 0919 138/70     Pulse Rate 02/12/16 0919 68     Resp 02/12/16 0919 (!)  24     Temp 02/12/16 0919 98.5 F (36.9 C)     Temp Source 02/12/16 0919 Oral     SpO2 02/12/16 0918 (!) 82 %     Weight 02/12/16 0920 130 lb (59 kg)     Height 02/12/16 0920 '5\' 4"'$  (1.626 m)     Head Circumference --      Peak Flow --      Pain Score --      Pain Loc --      Pain Edu? --      Excl. in [place]? --     Vital signs reviewed, nursing assessments reviewed.   Constitutional:   Alert and oriented. Ill-appearing, mild respiratory distress. Eyes:   No scleral icterus. No  conjunctival pallor. PERRL. EOMI.  No nystagmus. ENT   Head:   Normocephalic and atraumatic.   Nose:   No congestion/rhinnorhea. No septal hematoma   Mouth/Throat:   Dry mucous membranes, no pharyngeal erythema. No peritonsillar mass.    Neck:   No stridor. No SubQ emphysema. No meningismus. Hematological/Lymphatic/Immunilogical:   No cervical lymphadenopathy. Cardiovascular:   Tachycardia heart rate 105. Symmetric bilateral radial and DP pulses.  No murmurs.  Respiratory:   Tachypnea, expiratory wheezing. No focal crackles or consolidation. Gastrointestinal:   Soft and nontender. Non distended. There is no CVA tenderness.  No rebound, rigidity, or guarding. Genitourinary:   deferred Musculoskeletal:   Nontender with normal range of motion in all extremities. No joint effusions.  No lower extremity tenderness.  No edema. Neurologic:   Normal speech and language.  CN 2-10 normal. Motor grossly intact. No gross focal neurologic deficits are appreciated.  Skin:    Skin is warm, dry and intact. No rash noted.  No petechiae, purpura, or bullae.  ____________________________________________    LABS (pertinent positives/negatives) (all labs ordered are listed, but only abnormal results are displayed) Labs Reviewed  CBC WITH DIFFERENTIAL/PLATELET - Abnormal; Notable for the following:       Result Value   WBC 14.0 (*)    Neutro Abs 11.8 (*)    Lymphs Abs 0.8 (*)    Monocytes Absolute 1.3 (*)    All other components within normal limits  COMPREHENSIVE METABOLIC PANEL - Abnormal; Notable for the following:    Potassium 3.3 (*)    Chloride 98 (*)    Glucose, Bld 144 (*)    Calcium 8.6 (*)    All other components within normal limits  CULTURE, BLOOD (ROUTINE X 2)  CULTURE, BLOOD (ROUTINE X 2)  TROPONIN I   ____________________________________________   EKG  Interpreted by me Sinus tachycardia rate 106, normal axis and intervals. Normal QRS ST segments and T  waves.  ____________________________________________    RADIOLOGY  Chest x-ray grossly unchanged from previous. Status post right pneumonectomy.  ____________________________________________   PROCEDURES Procedures  ____________________________________________   INITIAL IMPRESSION / ASSESSMENT AND PLAN / ED COURSE  Pertinent labs & imaging results that were available during my care of the patient were reviewed by me and considered in my medical decision making (see chart for details).  Patient presents with acute on chronic respiratory failure with hypoxia, secondary to his COPD exacerbation. We'll give Solu-Medrol nebs and Levaquin. Low suspicion for PE ACS dissection or pericarditis. Oxygenation stabilized on 4-5 L nasal cannula. Case discussed with hospitalist for admission.     Clinical Course    ____________________________________________   FINAL CLINICAL IMPRESSION(S) / ED DIAGNOSES  Final diagnoses:  Acute on chronic respiratory failure  with hypoxia ([place])  COPD exacerbation ([place])       Portions of this note were generated with dragon dictation software. Dictation errors may occur despite best attempts at proofreading.    Carrie Mew, MD 02/12/16 [phone]

## 2016-02-12 NOTE — ED Triage Notes (Signed)
C/o [facility] for 2 days. Pt only has one lung. Reports feels like when has PNA. Wears 2 L  all time. Not labored at this time.

## 2016-02-12 NOTE — ED Notes (Signed)
Pt discharged home after verbalizing understanding of discharge instructions; nad noted. 

## 2016-02-12 NOTE — ED Notes (Signed)
0927 first blood cx drawn right forearm. Second 0930 right ac

## 2016-02-12 NOTE — H&P (Signed)
[facility] [place] NAME: Kristie [name]    MR#:  [id number]  DATE OF BIRTH:  02-23-1927  DATE OF ADMISSION:  02/12/2016  PRIMARY CARE PHYSICIAN: Leonel Ramsay, MD   REQUESTING/REFERRING PHYSICIAN: Dwana Curd  CHIEF COMPLAINT:   Chief Complaint  Patient presents with  . Shortness of Breath    HISTORY OF PRESENT ILLNESS: Kristie [name]  is a 82 y.o. female with a known history of Squamous cell lung cancer status post right-sided pneumonectomy in 1979, COPD and on chronic oxygen since 2006, very active at baseline and uses her inhalers regularly. For last 3 days she is having shortness of breath and cough, she called her pulmonologist office but because of the holidays the office was closed and she did not get any good response. Her problem continued so she decided to come to emergency room today. She required to be placed on 4-5 L of oxygen to get 90% oxygen saturation, heart rate is running up to 130, chest x-ray is clear. She is given as admission for COPD exacerbation.  PAST MEDICAL HISTORY:   Past Medical History:  Diagnosis Date  . COPD (chronic obstructive pulmonary disease) ([place])   . Glaucoma   . Restless legs     PAST SURGICAL HISTORY: Past Surgical History:  Procedure Laterality Date  . APPENDECTOMY    . LUNG REMOVAL, PARTIAL Right    squamous cell carcinoma, all lobes on the right were removed.  . TONSILLECTOMY      SOCIAL HISTORY:  Social History  Substance Use Topics  . Smoking status: Former Research scientist (life sciences)  . Smokeless tobacco: Not on file  . Alcohol use Yes     Comment: occasionally    FAMILY HISTORY:  Family History  Problem Relation Age of Onset  . Bladder Cancer Mother   . CAD Father     DRUG ALLERGIES: No Known Allergies  REVIEW OF SYSTEMS:   CONSTITUTIONAL: No fever, fatigue or weakness.  EYES: No blurred or double vision.  EARS, NOSE, AND THROAT: No tinnitus or ear pain.  RESPIRATORY:  Positive for cough, shortness of breath, wheezing , no hemoptysis.  CARDIOVASCULAR: No chest pain, orthopnea, edema.  GASTROINTESTINAL: No nausea, vomiting, diarrhea or abdominal pain.  GENITOURINARY: No dysuria, hematuria.  ENDOCRINE: No polyuria, nocturia,  HEMATOLOGY: No anemia, easy bruising or bleeding SKIN: No rash or lesion. MUSCULOSKELETAL: No joint pain or arthritis.   NEUROLOGIC: No tingling, numbness, weakness.  PSYCHIATRY: No anxiety or depression.   MEDICATIONS AT HOME:  Prior to Admission medications   Medication Sig Start Date End Date Taking? Authorizing Provider  BREO ELLIPTA 100-25 MCG/INH AEPB Inhale 1 puff into the lungs every morning. 11/22/14  Yes Historical Provider, MD  chlorpheniramine (CHLOR-TRIMETON) 4 MG tablet Take 4 mg by mouth every evening.   Yes Historical Provider, MD  gabapentin (NEURONTIN) 300 MG capsule Take 300 mg by mouth every evening. 11/05/14  Yes Historical Provider, MD  latanoprost (XALATAN) 0.005 % ophthalmic solution Place 1 drop into both eyes every evening. 10/01/14  Yes Historical Provider, MD  Magnesium 250 MG TABS Take 250 mg by mouth daily with breakfast.   Yes Historical Provider, MD  Multiple Vitamin (MULTI VITAMIN DAILY PO) Take 1 tablet by mouth daily.   Yes Historical Provider, MD  naproxen sodium (ALEVE) 220 MG tablet Take 220 mg by mouth 2 (two) times daily with a meal.   Yes Historical Provider, MD  rOPINIRole (REQUIP) 1 MG tablet Take 1  mg by mouth at bedtime. 09/29/14  Yes Historical Provider, MD  SPIRIVA HANDIHALER 18 MCG inhalation capsule Place 18 mcg into inhaler and inhale every evening. 11/13/14  Yes Historical Provider, MD  acetaminophen-codeine (TYLENOL #3) 300-30 MG per tablet Take 1 tablet by mouth every 6 (six) hours as needed for moderate pain. Patient not taking: Reported on 02/12/2016 11/28/14   [name], MD      PHYSICAL EXAMINATION:   VITAL SIGNS: Blood pressure 109/67, pulse 100, temperature 98.5 F  (36.9 C), temperature source Oral, resp. rate (!) 24, height '5\' 4"'$  (1.626 m), weight 59 kg (130 lb), SpO2 93 %.  GENERAL:  82 y.o.-year-old patient lying in the bed with no acute distress.  EYES: Pupils equal, round, reactive to light and accommodation. No scleral icterus. Extraocular muscles intact.  HEENT: Head atraumatic, normocephalic. Oropharynx and nasopharynx clear.  NECK:  Supple, no jugular venous distention. No thyroid enlargement, no tenderness.  LUNGS: No breath sounds on the right side, on left side she has wheezing. no crepitation. No use of accessory muscles of respiration.  CARDIOVASCULAR: S1, S2 normal. No murmurs, rubs, or gallops.  ABDOMEN: Soft, nontender, nondistended. Bowel sounds present. No organomegaly or mass.  EXTREMITIES: No pedal edema, cyanosis, or clubbing.  NEUROLOGIC: Cranial nerves II through XII are intact. Muscle strength 5/5 in all extremities. Sensation intact. Gait not checked.  PSYCHIATRIC: The patient is alert and oriented x 3.  SKIN: No obvious rash, lesion, or ulcer.   LABORATORY PANEL:   CBC  Recent Labs Lab 02/12/16 0924  WBC 14.0*  HGB 14.6  HCT 42.6  PLT 190  MCV 89.3  MCH 30.6  MCHC 34.3  RDW 14.4  LYMPHSABS 0.8*  MONOABS 1.3*  EOSABS 0.0  BASOSABS 0.0   ------------------------------------------------------------------------------------------------------------------  Chemistries   Recent Labs Lab 02/12/16 0924  NA 136  K 3.3*  CL 98*  CO2 27  GLUCOSE 144*  BUN 14  CREATININE 0.54  CALCIUM 8.6*  AST 24  ALT 17  ALKPHOS 77  BILITOT 1.2   ------------------------------------------------------------------------------------------------------------------ estimated creatinine clearance is 42.8 mL/min (by C-G formula based on SCr of 0.54 mg/dL). ------------------------------------------------------------------------------------------------------------------ No results for input(s): TSH, T4TOTAL, T3FREE, THYROIDAB in  the last 72 hours.  Invalid input(s): FREET3   Coagulation profile No results for input(s): INR, PROTIME in the last 168 hours. ------------------------------------------------------------------------------------------------------------------- No results for input(s): DDIMER in the last 72 hours. -------------------------------------------------------------------------------------------------------------------  Cardiac Enzymes  Recent Labs Lab 02/12/16 0924  TROPONINI <0.03   ------------------------------------------------------------------------------------------------------------------ Invalid input(s): POCBNP  ---------------------------------------------------------------------------------------------------------------  Urinalysis No results found for: COLORURINE, APPEARANCEUR, LABSPEC, PHURINE, GLUCOSEU, HGBUR, BILIRUBINUR, KETONESUR, PROTEINUR, UROBILINOGEN, NITRITE, LEUKOCYTESUR   RADIOLOGY: Dg Chest 2 View  Result Date: 02/12/2016 CLINICAL DATA:  Dyspnea, wheezing EXAM: CHEST  2 VIEW COMPARISON:  07/04/2014 FINDINGS: Cardiomediastinal silhouette is stable. Again noted volume loss in complete opacification of the right hemi thorax post right pneumonectomy. Stable left basilar atelectasis or scarring. Mild atherosclerotic calcifications of thoracic aorta. No pulmonary edema. IMPRESSION: Again noted volume loss in complete opacification of the right hemi thorax post right pneumonectomy. Stable left basilar atelectasis or scarring. Mild atherosclerotic calcifications of thoracic aorta. No pulmonary edema. Electronically Signed   By: Lahoma Crocker M.D.   On: 02/12/2016 10:05    EKG: Orders placed or performed during the hospital encounter of 02/12/16  . ED EKG  . ED EKG    IMPRESSION AND PLAN:  * Acute on chronic respiratory failure with hypoxia   Acute exacerbation of COPD  No signs of associated infection.    Continue supplemental oxygen via nasal cannula.   IV  steroid and nebulizer therapy for bronchodilators.   Continue Spiriva.   At this time I'm not starting on antibiotics, we need to reassess and decide.   Incentive spirometry.  * Glaucoma   Continue eye drops.  * Tachycardia   Heart rate is up to 130.   This could be as a reaction to hypoxia.   Continue on cardiac monitoring for now without any intervention.  * Restless leg syndrome   Continue gabapentin.     All the records are reviewed and case discussed with ED provider. Management plans discussed with the patient, family and they are in agreement.  CODE STATUS: DO NOT RESUSCITATE Code Status History    This patient does not have a recorded code status. Please follow your organizational policy for patients in this situation.     I discussed with the patient's husband who was present in the room patient want him to be her healthcare power of attorney in any adverse event, and she confirmed DO NOT RESUSCITATE status for her.  TOTAL TIME TAKING CARE OF THIS PATIENT: 50 minutes.    Vaughan Basta M.D on 02/12/2016   Between 7am to 6pm - Pager - [phone]  After 6pm go to [email or web address] - password [email or web address] [facility]  Office  [phone]  CC: Primary care physician; FITZGERALD, DAVID [name], MD   Note: This dictation was prepared with Dragon dictation along with smaller phrase technology. Any transcriptional errors that result from this process are unintentional.

## 2016-02-13 LAB — BASIC METABOLIC PANEL
Anion gap: 8 (ref 5–15)
BUN: 14 mg/dL (ref 6–20)
CO2: 33 mmol/L — AB (ref 22–32)
Calcium: 9 mg/dL (ref 8.9–10.3)
Chloride: 99 mmol/L — ABNORMAL LOW (ref 101–111)
Creatinine, Ser: 0.44 mg/dL (ref 0.44–1.00)
GFR calc Af Amer: >60 mL/min (ref >60)
GLUCOSE: 200 mg/dL — AB (ref 65–99)
POTASSIUM: 4.2 mmol/L (ref 3.5–5.1)
Sodium: 140 mmol/L (ref 135–145)

## 2016-02-13 LAB — CBC
HEMATOCRIT: 43.4 % (ref 35.0–47.0)
Hemoglobin: 14.8 g/dL (ref 12.0–16.0)
MCH: 30.3 pg (ref 26.0–34.0)
MCHC: 34 g/dL (ref 32.0–36.0)
MCV: 89.2 fL (ref 80.0–100.0)
Platelets: 196 10*3/uL (ref 150–440)
RBC: 4.87 MIL/uL (ref 3.80–5.20)
RDW: 14.1 % (ref 11.5–14.5)
WBC: 7.1 10*3/uL (ref 3.6–11.0)

## 2016-02-13 MED ORDER — METHYLPREDNISOLONE SODIUM SUCC 125 MG IJ SOLR
60.0000 mg | Freq: Two times a day (BID) | INTRAMUSCULAR | Status: DC
  Administered 2016-02-13 – 2016-02-14 (×2): 60 mg via INTRAVENOUS
  Filled 2016-02-13 (×2): qty 2

## 2016-02-13 MED ORDER — DILTIAZEM HCL 30 MG PO TABS
30.0000 mg | ORAL_TABLET | Freq: Three times a day (TID) | ORAL | Status: DC
  Administered 2016-02-13 – 2016-02-14 (×3): 30 mg via ORAL
  Filled 2016-02-13 (×3): qty 1

## 2016-02-13 MED ORDER — IPRATROPIUM-ALBUTEROL 0.5-2.5 (3) MG/3ML IN SOLN: 3.0000 mL | RESPIRATORY_TRACT | Status: DC | PRN

## 2016-02-13 MED ORDER — FLUTICASONE FUROATE-VILANTEROL 100-25 MCG/INH IN AEPB
1.0000 | INHALATION_SPRAY | Freq: Every day | RESPIRATORY_TRACT | Status: DC
  Administered 2016-02-13 – 2016-02-15 (×3): 1 via RESPIRATORY_TRACT
  Filled 2016-02-13: qty 28

## 2016-02-13 NOTE — Progress Notes (Signed)
[facility] [place] NAME: Kristie [name]    MR#:  [id number]  DATE OF BIRTH:  [birth year]  SUBJECTIVE:  Came in with increasing shortness of breath. Heart rate up to 100s with exertion. Patient feels better than yesterday.  REVIEW OF SYSTEMS:   Review of Systems  Constitutional: Negative for chills, fever and weight loss.  HENT: Negative for ear discharge, ear pain and nosebleeds.   Eyes: Negative for blurred vision, pain and discharge.  Respiratory: Positive for shortness of breath. Negative for sputum production, wheezing and stridor.   Cardiovascular: Positive for palpitations. Negative for chest pain, orthopnea and PND.  Gastrointestinal: Negative for abdominal pain, diarrhea, nausea and vomiting.  Genitourinary: Negative for frequency and urgency.  Musculoskeletal: Negative for back pain and joint pain.  Neurological: Positive for weakness. Negative for sensory change, speech change and focal weakness.  Psychiatric/Behavioral: Negative for depression and hallucinations. The patient is not nervous/anxious.    Tolerating Diet: Yes Tolerating PT: Not needed  DRUG ALLERGIES:  No Known Allergies  VITALS:  Blood pressure 107/73, pulse (!) 117, temperature 98.3 F (36.8 C), temperature source Oral, resp. rate 18, height '5\' 4"'$  (1.626 m), weight 59.6 kg (131 lb 4.8 oz), SpO2 93 %.  PHYSICAL EXAMINATION:   Physical Exam  GENERAL:  82 y.o.-year-old patient lying in the bed with no acute distress.  EYES: Pupils equal, round, reactive to light and accommodation. No scleral icterus. Extraocular muscles intact.  HEENT: Head atraumatic, normocephalic. Oropharynx and nasopharynx clear.  NECK:  Supple, no jugular venous distention. No thyroid enlargement, no tenderness.  LUNGS: Distant breath sounds bilaterally, no wheezing, rales, rhonchi. No use of accessory muscles of respiration.  CARDIOVASCULAR: S1, S2 normal. No murmurs, rubs, or  gallops. Tachycardia ABDOMEN: Soft, nontender, nondistended. Bowel sounds present. No organomegaly or mass.  EXTREMITIES: No cyanosis, clubbing or edema b/l.    NEUROLOGIC: Cranial nerves II through XII are intact. No focal Motor or sensory deficits b/l.   PSYCHIATRIC:  patient is alert and oriented x 3.  SKIN: No obvious rash, lesion, or ulcer.   LABORATORY PANEL:  CBC  Recent Labs Lab 02/13/16 0416  WBC 7.1  HGB 14.8  HCT 43.4  PLT 196    Chemistries   Recent Labs Lab 02/12/16 0924 02/13/16 0416  NA 136 140  K 3.3* 4.2  CL 98* 99*  CO2 27 33*  GLUCOSE 144* 200*  BUN 14 14  CREATININE 0.54 0.44  CALCIUM 8.6* 9.0  AST 24  --   ALT 17  --   ALKPHOS 77  --   BILITOT 1.2  --    Cardiac Enzymes  Recent Labs Lab 02/12/16 0924  TROPONINI <0.03   RADIOLOGY:  Dg Chest 2 View  Result Date: 02/12/2016 CLINICAL DATA:  Dyspnea, wheezing EXAM: CHEST  2 VIEW COMPARISON:  07/04/2014 FINDINGS: Cardiomediastinal silhouette is stable. Again noted volume loss in complete opacification of the right hemi thorax post right pneumonectomy. Stable left basilar atelectasis or scarring. Mild atherosclerotic calcifications of thoracic aorta. No pulmonary edema. IMPRESSION: Again noted volume loss in complete opacification of the right hemi thorax post right pneumonectomy. Stable left basilar atelectasis or scarring. Mild atherosclerotic calcifications of thoracic aorta. No pulmonary edema. Electronically Signed   By: Lahoma Crocker M.D.   On: 02/12/2016 10:05   ASSESSMENT AND PLAN:  Kristie [name]  is a 82 y.o. female with a known history of Squamous cell lung cancer status post right-sided pneumonectomy  in 1979, COPD and on chronic oxygen since 2006, very active at baseline and uses her inhalers regularly. For last 3 days she is having shortness of breath and cough  * Acute on chronic respiratory failure with hypoxia   Acute exacerbation of COPD   No signs of associated infection.  Continue  supplemental oxygen via nasal cannula.   IV steroid and nebulizer therapy for bronchodilators.   Continue Spiriva.    no indication for antibiotics   Incentive spirometry.  * Glaucoma   Continue eye drops.  * Tachycardia   Heart rate is up to 130.   This could be as a reaction to hypoxia.   Continue on cardiac monitoring for now without any intervention. -Empiric Cardizem 30 mg 3 times a day  * Restless leg syndrome   Continue gabapentin.  Overall slow improvement. We'll continue low management. Change to oral steroid taper tomorrow if continues to show improvement.  Case discussed with Care Management/Social Worker. Management plans discussed with the patient, family and they are in agreement.  CODE STATUS: DO NOT RESUSCITATE  DVT Prophylaxis: Lovenox  TOTAL TIME TAKING CARE OF THIS PATIENT: 25 minutes.  >50% time spent on counselling and coordination of care  POSSIBLE D/C IN one to 2* DAYS, DEPENDING ON CLINICAL CONDITION.  Note: This dictation was prepared with Dragon dictation along with smaller phrase technology. Any transcriptional errors that result from this process are unintentional.  Kristie [name] M.D on 02/13/2016 at 3:38 PM  Between 7am to 6pm - Pager - [phone]  After 6pm go to [email or web address] - password [email or web address] [facility]  [place] [facility]  Office  [phone]  CC: Primary care physician; Leonel Ramsay, MD

## 2016-02-13 NOTE — Progress Notes (Signed)
Dr. Posey Pronto notified patient changed from Sinus Tach to A Fib on telemetry in the past 10 min ranging from 120's to 160's. MD to place appropriate order. Will continue to closely monitor.

## 2016-02-13 NOTE — Care Management Important Message (Signed)
Important Message  Patient Details  Name: Renezmae Canlas MRN: [id number] Date of Birth: 05-Nov-1926   Medicare Important Message Given:  Yes    Shelbie Ammons, RN 02/13/2016, 9:57 AM

## 2016-02-13 NOTE — Progress Notes (Signed)
Inpatient Diabetes Program Recommendations  AACE/ADA: New Consensus Statement on Inpatient Glycemic Control (2015)  Target Ranges:  Prepandial:   less than 140 mg/dL      Peak postprandial:   less than 180 mg/dL (1-2 hours)      Critically ill patients:  140 - 180 mg/dL   Results for MICAELA, STITH (MRN [id number]) as of 02/13/2016 08:49  Ref. Range 02/12/2016 09:24 02/13/2016 04:16  Glucose Latest Ref Range: 65 - 99 mg/dL 144 (H) 200 (H)    Admit with: SOB  History: COPD, Lung Cancer      MD- Note patient currently receiving Solumedrol 60 mg Q6 hours.  Lab glucose elevated this AM.  If patient will remain on steroids, please consider placing orders for CBG checks TID AC + HS and cover with Novolog Sensitive Correction Scale/ SSI (0-9 units) TID AC + HS      --Will follow patient during hospitalization--  Wyn Quaker RN, MSN, CDE Diabetes Coordinator Inpatient Glycemic Control Team Team Pager: [phone] (8a-5p)

## 2016-02-13 NOTE — Evaluation (Signed)
Physical Therapy Evaluation Patient Details Name: Kristie [name] MRN: [id number] DOB: 06-Jan-1927 Today's Date: 02/13/2016   History of Present Illness  82 y.o. female with a known history of Squamous cell lung cancer (right-sided pneumonectomy in 1979), COPD and on chronic oxygen since 2006, very active at baseline and uses her inhalers regularly. For last few days she has had shortness of breath and cough, she called her pulmonologist office but because of the holidays the office was closed and she did not get any good response. Her problem continued so she decided to come to emergency room where she needed 5-6 liters O2 and had elevated HR.  Clinical Impression  Pt did relatively well with mobility, ambulation, safety but had significant change in vitals (HR up to 160s, O2 to low 80s).  Pt on 2 liters O2 at baseline, on 3 liters today during exam.  She showed good motivation and effort with PT exam and with minimal walking (75 ft) she did not have a lot fatigue or signs and symptoms of significant change in vitals.  Pt will benefit from HHPT and possible transition to [facility]/etc type setting.     Follow Up Recommendations Home health PT    Equipment Recommendations  Rolling walker with 5" wheels    Recommendations for Other Services       Precautions / Restrictions Precautions Precautions: Fall      Mobility  Bed Mobility Overal bed mobility: Independent             General bed mobility comments: Pt able to get to EOB w/o assist  Transfers Overall transfer level: Independent Equipment used: Rolling walker (2 wheeled)             General transfer comment: Pt with good confidence and balance getting to standing at EOB.  Ambulation/Gait Ambulation/Gait assistance: Modified independent (Device/Increase time) Ambulation Distance (Feet): 75 Feet Assistive device: Rolling walker (2 wheeled);None       General Gait Details: Pt initially felt weak and  wanted to use the walker, but after a little ambulation she felt confident going w/o it.  She had no LOBs and generally had good safety.   She did have inconsistent HR during the effort with BPM from 110s-160s as well as O2 drop to low 80s.  Pt was not symptomatic in this regard with only mild fatigue and no sense of racing heart or excessive shortness of breath.  Stairs            Wheelchair Mobility    Modified Rankin (Stroke Patients Only)       Balance Overall balance assessment: Independent                                           Pertinent Vitals/Pain Pain Assessment: No/denies pain    Home Living Family/patient expects to be discharged to:: Private residence Living Arrangements: Spouse/significant other Available Help at Discharge: Family;Friend(s) Type of Home: Independent living facility         Home Equipment: Kasandra Knudsen - single point      Prior Function Level of Independence: Independent         Comments: Apparently she was driving, relatively active, did all she needed     Hand Dominance        Extremity/Trunk Assessment   Upper Extremity Assessment: Overall WFL for tasks assessed (L wrist/grip limited 2/2 old fx)  Lower Extremity Assessment: Overall WFL for tasks assessed         Communication   Communication: No difficulties  Cognition Arousal/Alertness: Awake/alert Behavior During Therapy: WFL for tasks assessed/performed Overall Cognitive Status: Within Functional Limits for tasks assessed                      General Comments      Exercises     Assessment/Plan    PT Assessment Patient needs continued PT services  PT Problem List Decreased strength;Decreased activity tolerance;Decreased balance;Decreased safety awareness          PT Treatment Interventions Gait training;DME instruction;Functional mobility training;Therapeutic activities;Balance training;Therapeutic exercise    PT Goals  (Current goals can be found in the Care Plan section)  Acute Rehab PT Goals Patient Stated Goal: go home PT Goal Formulation: With patient Time For Goal Achievement: 02/27/16 Potential to Achieve Goals: Good    Frequency Min 2X/week   Barriers to discharge        Co-evaluation               End of Session Equipment Utilized During Treatment: Gait belt Activity Tolerance: Patient limited by fatigue   Nurse Communication: Other (comment) (changes in vital stats)         Time: 1350-1415 PT Time Calculation (min) (ACUTE ONLY): 25 min   Charges:   PT Evaluation $PT Eval Low Complexity: 1 Procedure     PT G CodesKreg Shropshire, DPT 02/13/2016, 3:35 PM

## 2016-02-13 NOTE — Progress Notes (Signed)
Initial Nutrition Assessment  DOCUMENTATION CODES:   Non-severe (moderate) malnutrition in context of chronic illness  INTERVENTION:  1. Magic cup TID with meals, each supplement provides 290 kcal and 9 grams of protein  NUTRITION DIAGNOSIS:   Malnutrition related to chronic illness as evidenced by moderate depletion of body fat, moderate depletions of muscle mass.  GOAL:   Patient will meet greater than or equal to 90% of their needs  MONITOR:   PO intake, I & O's, Labs, Weight trends, Supplement acceptance  REASON FOR ASSESSMENT:   Malnutrition Screening Tool    ASSESSMENT:   Kristie [name]  is a 82 y.o. female with a known history of Squamous cell lung cancer status post right-sided pneumonectomy in 1979, COPD and on chronic oxygen since 2006, very active at baseline and uses her inhalers regularly  Spoke with Kristie [name] at bedside. She admits to a usual body weight of approximately 140# States her Husband had a procedure back in September, he lost 20# and she lost 10# as a result of the stress. Claims she had a good appetite during that time:  For breakfast: Cereal, Orange Juice, Coffee For Lunch she had a sandwich and a "beverage" For dinner She states she cooked, ate meat, potatoes, veggies, pasta etc. She denies nausea/vomiting or diarrhea/constipation at this time.  Nutrition-Focused physical exam completed. Findings are moderate fat depletion, moderate muscle depletion, and no edema.   Labs and medications reviewed  Diet Order:  Diet regular Room service appropriate? Yes; Fluid consistency: Thin  Skin:  Reviewed, no issues  Last BM:  11/26  Height:   Ht Readings from Last 1 Encounters:  02/12/16 '5\' 4"'$  (1.626 m)    Weight:   Wt Readings from Last 1 Encounters:  02/12/16 131 lb 4.8 oz (59.6 kg)    Ideal Body Weight:  54.54 kg  BMI:  Body mass index is 22.54 kg/m.  Estimated Nutritional Needs:   Kcal:  1500-1800 calories  Protein:  59-72  gm  Fluid:  >/= 1.5L  EDUCATION NEEDS:   No education needs identified at this time  Kristie Anis. Garion Wempe, MS, RD LDN Inpatient Clinical Dietitian Pager [phone]

## 2016-02-13 NOTE — Progress Notes (Signed)
Dr. Posey Pronto notified patient takes BREO 100-25 mcg/inhaler daily. Verbal order to re-order as taken prior to admission.

## 2016-02-13 NOTE — Care Management (Signed)
Admitted to this facility with the diagnosis of COPD. Lives with husband, Delfino Lovett [phone]).A resident of [place] x 12 years.  Last seen Dr. Ola Spurr a couple of months ago. Chronic home oxygen 2 liters per nasal cannula continuous x 11 years. [facility] provides oxygen. No home health. No skilled facility. Takes care of all basic activities of daily living herself, can drive, if needed. Prescriptions are filled at [facility]. Cane available, if needed. Last fall was a year ago. Good appetite. Unsure who will be transporting home. Shelbie Ammons RN MSN CCM Care Management

## 2016-02-14 LAB — MAGNESIUM: MAGNESIUM: 2.3 mg/dL (ref 1.7–2.4)

## 2016-02-14 MED ORDER — DILTIAZEM HCL ER COATED BEADS 120 MG PO CP24
120.0000 mg | ORAL_CAPSULE | Freq: Every day | ORAL | Status: DC
  Administered 2016-02-14 – 2016-02-15 (×2): 120 mg via ORAL
  Filled 2016-02-14 (×2): qty 1

## 2016-02-14 MED ORDER — METHYLPREDNISOLONE SODIUM SUCC 125 MG IJ SOLR: 60.0000 mg | Freq: Every day | INTRAMUSCULAR | Status: DC

## 2016-02-14 NOTE — Progress Notes (Signed)
[facility] at [place] NAME: Kristie [name]    MR#:  [id number]  DATE OF BIRTH:  [birth year]  SUBJECTIVE:  Heart rate up to 100s at rest/minimal exertion. Patient slowly improving  REVIEW OF SYSTEMS:   Review of Systems  Constitutional: Negative for chills, fever and weight loss.  HENT: Negative for ear discharge, ear pain and nosebleeds.   Eyes: Negative for blurred vision, pain and discharge.  Respiratory: Positive for shortness of breath. Negative for sputum production, wheezing and stridor.   Cardiovascular: Positive for palpitations. Negative for chest pain, orthopnea and PND.  Gastrointestinal: Negative for abdominal pain, diarrhea, nausea and vomiting.  Genitourinary: Negative for frequency and urgency.  Musculoskeletal: Negative for back pain and joint pain.  Neurological: Positive for weakness. Negative for sensory change, speech change and focal weakness.  Psychiatric/Behavioral: Negative for depression and hallucinations. The patient is not nervous/anxious.    Tolerating Diet: Yes Tolerating PT: Not needed  DRUG ALLERGIES:  No Known Allergies  VITALS:  Blood pressure (!) 109/53, pulse 88, temperature 97.6 F (36.4 C), temperature source Oral, resp. rate 18, height '5\' 4"'$  (1.626 m), weight 59.6 kg (131 lb 4.8 oz), SpO2 96 %.  PHYSICAL EXAMINATION:   Physical Exam  GENERAL:  82 y.o.-year-old patient lying in the bed with no acute distress.  EYES: Pupils equal, round, reactive to light and accommodation. No scleral icterus. Extraocular muscles intact.  HEENT: Head atraumatic, normocephalic. Oropharynx and nasopharynx clear.  NECK:  Supple, no jugular venous distention. No thyroid enlargement, no tenderness.  LUNGS: Distant breath sounds bilaterally, no wheezing, rales, rhonchi. No use of accessory muscles of respiration.  CARDIOVASCULAR: S1, S2 normal. No murmurs, rubs, or gallops. Tachycardia ABDOMEN: Soft, nontender,  nondistended. Bowel sounds present. No organomegaly or mass.  EXTREMITIES: No cyanosis, clubbing or edema b/l.    NEUROLOGIC: Cranial nerves II through XII are intact. No focal Motor or sensory deficits b/l.   PSYCHIATRIC:  patient is alert and oriented x 3.  SKIN: No obvious rash, lesion, or ulcer.   LABORATORY PANEL:  CBC  Recent Labs Lab 02/13/16 0416  WBC 7.1  HGB 14.8  HCT 43.4  PLT 196    Chemistries   Recent Labs Lab 02/12/16 0924 02/13/16 0416 02/14/16 0554  NA 136 140  --   K 3.3* 4.2  --   CL 98* 99*  --   CO2 27 33*  --   GLUCOSE 144* 200*  --   BUN 14 14  --   CREATININE 0.54 0.44  --   CALCIUM 8.6* 9.0  --   MG  --   --  2.3  AST 24  --   --   ALT 17  --   --   ALKPHOS 77  --   --   BILITOT 1.2  --   --    Cardiac Enzymes  Recent Labs Lab 02/12/16 0924  TROPONINI <0.03   RADIOLOGY:  No results found. ASSESSMENT AND PLAN:  Kristie [name]  is a 82 y.o. female with a known history of Squamous cell lung cancer status post right-sided pneumonectomy in 1979, COPD and on chronic oxygen since 2006, very active at baseline and uses her inhalers regularly. For last 3 days she is having shortness of breath and cough  * Acute on chronic respiratory failure with hypoxia   Acute exacerbation of COPD   No signs of associated infection.  Continue supplemental oxygen via nasal cannula.  IV steroid and nebulizer therapy for bronchodilators.   Continue Spiriva.    no indication for antibiotics   Incentive spirometry. - taper steroids (change to once daily)  * Glaucoma   Continue eye drops.  * Tachycardia   Heart rate is up to 100s.   This could be as a reaction to hypoxia.   Continue on cardiac monitoring for now without any intervention. - change cardizem to CD (120 mg) for better rate control  * Restless leg syndrome   Continue gabapentin.  Overall slow improvement. We'll continue above management. Change to oral steroid taper tomorrow on  D/C  Case discussed with Care Management/Social Worker. Management plans discussed with the patient, RN and they are in agreement.  CODE STATUS: DO NOT RESUSCITATE  DVT Prophylaxis: Lovenox  TOTAL TIME TAKING CARE OF THIS PATIENT: 25 minutes.  >50% time spent on counselling and coordination of care  POSSIBLE D/C IN AM, DEPENDING ON CLINICAL CONDITION.  Note: This dictation was prepared with Dragon dictation along with smaller phrase technology. Any transcriptional errors that result from this process are unintentional.  Max Sane M.D on 02/14/2016 at 1:38 PM  Between 7am to 6pm - Pager - [phone]  After 6pm go to [email or web address] - password [email or web address] [facility]  [place] [facility]  Office  [phone]  CC: Primary care physician; Leonel Ramsay, MD

## 2016-02-14 NOTE — Progress Notes (Signed)
Family Meeting Note  Advance Directive:yes  Today a meeting took place with the Patient.  The following clinical team members were present during this meeting:MD  The following were discussed:Patient's diagnosis: , Patient's progosis: > 12 months and Goals for treatment: DNR  Additional follow-up to be provided: consider palliative care as an outpt  Time spent during discussion:20 minutes  Max Sane, MD

## 2016-02-15 LAB — BASIC METABOLIC PANEL
Anion gap: 5 (ref 5–15)
BUN: 29 mg/dL — AB (ref 6–20)
CHLORIDE: 102 mmol/L (ref 101–111)
CO2: 34 mmol/L — ABNORMAL HIGH (ref 22–32)
Calcium: 8.8 mg/dL — ABNORMAL LOW (ref 8.9–10.3)
Creatinine, Ser: 0.48 mg/dL (ref 0.44–1.00)
GFR calc Af Amer: >60 mL/min (ref >60)
GFR calc non Af Amer: >60 mL/min (ref >60)
GLUCOSE: 141 mg/dL — AB (ref 65–99)
POTASSIUM: 3.7 mmol/L (ref 3.5–5.1)
Sodium: 141 mmol/L (ref 135–145)

## 2016-02-15 LAB — CBC
HCT: 42 % (ref 35.0–47.0)
HEMOGLOBIN: 14.2 g/dL (ref 12.0–16.0)
MCH: 30.2 pg (ref 26.0–34.0)
MCHC: 33.9 g/dL (ref 32.0–36.0)
MCV: 89.1 fL (ref 80.0–100.0)
Platelets: 241 10*3/uL (ref 150–440)
RBC: 4.72 MIL/uL (ref 3.80–5.20)
RDW: 13.9 % (ref 11.5–14.5)
WBC: 12.5 10*3/uL — ABNORMAL HIGH (ref 3.6–11.0)

## 2016-02-15 MED ORDER — DILTIAZEM HCL ER COATED BEADS 120 MG PO CP24: 120.0000 mg | ORAL_CAPSULE | Freq: Every day | ORAL | 0 refills | Status: DC

## 2016-02-15 MED ORDER — PREDNISONE 10 MG (21) PO TBPK: 10.0000 mg | ORAL_TABLET | Freq: Every day | ORAL | 0 refills | Status: DC

## 2016-02-15 NOTE — Care Management (Signed)
Physical therapy evaluation completed. Recommending home with home health and physical therapy. Spoke with [name] RN at [facility]. Will fax Home Health, Physical therapy, and occupational outpatients orders over to [place] [facility]. A representative will be calling Kristie [name] to arrange visits to her home. Husband will transport. Discharge to home today per Dr. Manuella Ghazi. Shelbie Ammons RN MSN CCM Care Management

## 2016-02-15 NOTE — Progress Notes (Signed)
Patient discharged home with home health per MD order. All discharge instructions given and all questions answered.

## 2016-02-16 NOTE — Discharge Summary (Signed)
[place] at [place] NAME: Kristie [name]    MR#:  [id number]  DATE OF BIRTH:  29-Aug-1926  DATE OF ADMISSION:  02/12/2016   ADMITTING PHYSICIAN: Vaughan Basta, MD  DATE OF DISCHARGE: 02/15/2016 10:12 AM  PRIMARY CARE PHYSICIAN: Leonel Ramsay, MD   ADMISSION DIAGNOSIS:  COPD exacerbation ([place]) [J44.1] Acute on chronic respiratory failure with hypoxia ([place]) [J96.21] DISCHARGE DIAGNOSIS:  Active Problems:   COPD exacerbation ([place])  SECONDARY DIAGNOSIS:   Past Medical History:  Diagnosis Date  . COPD (chronic obstructive pulmonary disease) ([place])   . Glaucoma   . Restless legs    HOSPITAL COURSE:  JeanneTanneris a 82 y.o.femalewith a known history of Squamous cell lung cancer status post right-sided pneumonectomy in 1979, COPD and on chronic oxygen since 2006, very active at baseline and uses her inhalers regularly admitted for shortness of breath and cough  * Acute on chronic respiratory failure with hypoxia Acute exacerbation of COPD - improved with steroids, nebs  * Sinus Tachycardia: likely due to lung dz - improved on cardizem to CD (120 mg)   DISCHARGE CONDITIONS:  stable CONSULTS OBTAINED:   DRUG ALLERGIES:  No Known Allergies DISCHARGE MEDICATIONS:     Medication List    TAKE these medications   acetaminophen-codeine 300-30 MG tablet Commonly known as:  TYLENOL #3 Take 1 tablet by mouth every 6 (six) hours as needed for moderate pain.   ALEVE 220 MG tablet Generic drug:  naproxen sodium Take 220 mg by mouth 2 (two) times daily with a meal.   BREO ELLIPTA 100-25 MCG/INH Aepb Generic drug:  fluticasone furoate-vilanterol Inhale 1 puff into the lungs every morning.   chlorpheniramine 4 MG tablet Commonly known as:  CHLOR-TRIMETON Take 4 mg by mouth every evening.   diltiazem 120 MG 24 hr capsule Commonly known as:  CARDIZEM CD Take 1 capsule (120 mg total) by mouth daily.     gabapentin 300 MG capsule Commonly known as:  NEURONTIN Take 300 mg by mouth every evening.   latanoprost 0.005 % ophthalmic solution Commonly known as:  XALATAN Place 1 drop into both eyes every evening.   Magnesium 250 MG Tabs Take 250 mg by mouth daily with breakfast.   MULTI VITAMIN DAILY PO Take 1 tablet by mouth daily.   predniSONE 10 MG (21) Tbpk tablet Commonly known as:  STERAPRED UNI-PAK 21 TAB Take 1 tablet (10 mg total) by mouth daily. Start 60 mg once daily, taper 10 mg daily until done   rOPINIRole 1 MG tablet Commonly known as:  REQUIP Take 1 mg by mouth at bedtime.   SPIRIVA HANDIHALER 18 MCG inhalation capsule Generic drug:  tiotropium Place 18 mcg into inhaler and inhale every evening.        DISCHARGE INSTRUCTIONS:   DIET:  Regular diet DISCHARGE CONDITION:  Good ACTIVITY:  Activity as tolerated OXYGEN:  Home Oxygen: Yes.    Oxygen Delivery: resume chronic 2 liters O2 DISCHARGE LOCATION:  home   If you experience worsening of your admission symptoms, develop shortness of breath, life threatening emergency, suicidal or homicidal thoughts you must seek medical attention immediately by calling 911 or calling your MD immediately  if symptoms less severe.  You Must read complete instructions/literature along with all the possible adverse reactions/side effects for all the Medicines you take and that have been prescribed to you. Take any new Medicines after you have completely understood and accpet all the possible adverse reactions/side  effects.   Please note  You were cared for by a hospitalist during your hospital stay. If you have any questions about your discharge medications or the care you received while you were in the hospital after you are discharged, you can call the unit and asked to speak with the hospitalist on call if the hospitalist that took care of you is not available. Once you are discharged, your primary care physician will handle  any further medical issues. Please note that NO REFILLS for any discharge medications will be authorized once you are discharged, as it is imperative that you return to your primary care physician (or establish a relationship with a primary care physician if you do not have one) for your aftercare needs so that they can reassess your need for medications and monitor your lab values.    On the day of Discharge:  VITAL SIGNS:  Blood pressure (!) 122/57, pulse 74, temperature 97.9 F (36.6 C), temperature source Oral, resp. rate 18, height '5\' 4"'$  (1.626 m), weight 59.6 kg (131 lb 4.8 oz), SpO2 92 %. PHYSICAL EXAMINATION:  GENERAL:  82 y.o.-year-old patient lying in the bed with no acute distress.  EYES: Pupils equal, round, reactive to light and accommodation. No scleral icterus. Extraocular muscles intact.  HEENT: Head atraumatic, normocephalic. Oropharynx and nasopharynx clear.  NECK:  Supple, no jugular venous distention. No thyroid enlargement, no tenderness.  LUNGS: Normal breath sounds bilaterally, no wheezing, rales,rhonchi or crepitation. No use of accessory muscles of respiration.  CARDIOVASCULAR: S1, S2 normal. No murmurs, rubs, or gallops.  ABDOMEN: Soft, non-tender, non-distended. Bowel sounds present. No organomegaly or mass.  EXTREMITIES: No pedal edema, cyanosis, or clubbing.  NEUROLOGIC: Cranial nerves II through XII are intact. Muscle strength 5/5 in all extremities. Sensation intact. Gait not checked.  PSYCHIATRIC: The patient is alert and oriented x 3.  SKIN: No obvious rash, lesion, or ulcer.  DATA REVIEW:   CBC  Recent Labs Lab 02/15/16 0513  WBC 12.5*  HGB 14.2  HCT 42.0  PLT 241    Chemistries   Recent Labs Lab 02/12/16 0924  02/14/16 0554 02/15/16 0513  NA 136  < >  --  141  K 3.3*  < >  --  3.7  CL 98*  < >  --  102  CO2 27  < >  --  34*  GLUCOSE 144*  < >  --  141*  BUN 14  < >  --  29*  CREATININE 0.54  < >  --  0.48  CALCIUM 8.6*  < >  --  8.8*    MG  --   --  2.3  --   AST 24  --   --   --   ALT 17  --   --   --   ALKPHOS 77  --   --   --   BILITOT 1.2  --   --   --   < > = values in this interval not displayed.     Management plans discussed with the patient, family and they are in agreement.  CODE STATUS: DNR  TOTAL TIME TAKING CARE OF THIS PATIENT: 45 minutes.    Max Sane M.D on 02/16/2016 at 7:31 PM  Between 7am to 6pm - Pager - [phone]  After 6pm go to [email or web address] - [employer] [place] [facility]  Office  [phone]  CC: Primary care physician; Leonel Ramsay, MD   Note: This  dictation was prepared with Dragon dictation along with smaller phrase technology. Any transcriptional errors that result from this process are unintentional.

## 2016-02-17 LAB — CULTURE, BLOOD (ROUTINE X 2)
CULTURE: NO GROWTH
Culture: NO GROWTH

## 2016-02-27 DIAGNOSIS — R2681 Unsteadiness on feet: Secondary | ICD-10-CM | POA: Diagnosis not present

## 2016-02-27 DIAGNOSIS — M6281 Muscle weakness (generalized): Secondary | ICD-10-CM | POA: Diagnosis not present

## 2016-02-29 DIAGNOSIS — M6281 Muscle weakness (generalized): Secondary | ICD-10-CM | POA: Diagnosis not present

## 2016-02-29 DIAGNOSIS — R2681 Unsteadiness on feet: Secondary | ICD-10-CM | POA: Diagnosis not present

## 2016-03-05 DIAGNOSIS — M6281 Muscle weakness (generalized): Secondary | ICD-10-CM | POA: Diagnosis not present

## 2016-03-05 DIAGNOSIS — R2681 Unsteadiness on feet: Secondary | ICD-10-CM | POA: Diagnosis not present

## 2016-03-07 DIAGNOSIS — M6281 Muscle weakness (generalized): Secondary | ICD-10-CM | POA: Diagnosis not present

## 2016-03-07 DIAGNOSIS — R2681 Unsteadiness on feet: Secondary | ICD-10-CM | POA: Diagnosis not present

## 2016-03-25 DIAGNOSIS — J449 Chronic obstructive pulmonary disease, unspecified: Secondary | ICD-10-CM | POA: Diagnosis not present

## 2016-03-25 DIAGNOSIS — J9611 Chronic respiratory failure with hypoxia: Secondary | ICD-10-CM | POA: Diagnosis not present

## 2016-03-25 DIAGNOSIS — R05 Cough: Secondary | ICD-10-CM | POA: Diagnosis not present

## 2016-03-28 ENCOUNTER — Inpatient Hospital Stay
Admission: EM | Admit: 2016-03-28 | Discharge: 2016-04-01 | DRG: 193 | Disposition: A | Discharge status: Home / Self Care | Payer: Medicare Other | Attending: Specialist | Admitting: Specialist

## 2016-03-28 ENCOUNTER — Emergency Department: Payer: Medicare Other

## 2016-03-28 DIAGNOSIS — Z9981 Dependence on supplemental oxygen: Secondary | ICD-10-CM | POA: Diagnosis not present

## 2016-03-28 DIAGNOSIS — R2681 Unsteadiness on feet: Secondary | ICD-10-CM

## 2016-03-28 DIAGNOSIS — Z79899 Other long term (current) drug therapy: Secondary | ICD-10-CM

## 2016-03-28 DIAGNOSIS — Z85118 Personal history of other malignant neoplasm of bronchus and lung: Secondary | ICD-10-CM

## 2016-03-28 DIAGNOSIS — J189 Pneumonia, unspecified organism: Secondary | ICD-10-CM | POA: Diagnosis present

## 2016-03-28 DIAGNOSIS — R739 Hyperglycemia, unspecified: Secondary | ICD-10-CM | POA: Diagnosis not present

## 2016-03-28 DIAGNOSIS — H409 Unspecified glaucoma: Secondary | ICD-10-CM | POA: Diagnosis present

## 2016-03-28 DIAGNOSIS — J441 Chronic obstructive pulmonary disease with (acute) exacerbation: Secondary | ICD-10-CM | POA: Diagnosis present

## 2016-03-28 DIAGNOSIS — J440 Chronic obstructive pulmonary disease with acute lower respiratory infection: Secondary | ICD-10-CM | POA: Diagnosis present

## 2016-03-28 DIAGNOSIS — G2581 Restless legs syndrome: Secondary | ICD-10-CM | POA: Diagnosis present

## 2016-03-28 DIAGNOSIS — J9601 Acute respiratory failure with hypoxia: Secondary | ICD-10-CM | POA: Diagnosis not present

## 2016-03-28 DIAGNOSIS — J9621 Acute and chronic respiratory failure with hypoxia: Secondary | ICD-10-CM | POA: Diagnosis present

## 2016-03-28 DIAGNOSIS — Z87891 Personal history of nicotine dependence: Secondary | ICD-10-CM | POA: Diagnosis not present

## 2016-03-28 DIAGNOSIS — Z902 Acquired absence of lung [part of]: Secondary | ICD-10-CM | POA: Diagnosis not present

## 2016-03-28 DIAGNOSIS — Z66 Do not resuscitate: Secondary | ICD-10-CM | POA: Diagnosis present

## 2016-03-28 DIAGNOSIS — J209 Acute bronchitis, unspecified: Secondary | ICD-10-CM | POA: Diagnosis present

## 2016-03-28 DIAGNOSIS — R0602 Shortness of breath: Secondary | ICD-10-CM | POA: Diagnosis not present

## 2016-03-28 DIAGNOSIS — R0600 Dyspnea, unspecified: Secondary | ICD-10-CM | POA: Diagnosis not present

## 2016-03-28 LAB — BLOOD GAS, VENOUS
PATIENT TEMPERATURE: 37
pCO2, Ven: 57 mmHg (ref 44.0–60.0)
pH, Ven: 7.37 (ref 7.250–7.430)
pO2, Ven: 59 mmHg — ABNORMAL HIGH (ref 32.0–45.0)

## 2016-03-28 LAB — CBC WITH DIFFERENTIAL/PLATELET
BASOS PCT: 1 %
Basophils Absolute: 0.1 10*3/uL (ref 0–0.1)
Eosinophils Absolute: 0 10*3/uL (ref 0–0.7)
Eosinophils Relative: 0 %
HEMATOCRIT: 42.9 % (ref 35.0–47.0)
Hemoglobin: 14.7 g/dL (ref 12.0–16.0)
LYMPHS ABS: 1 10*3/uL (ref 1.0–3.6)
Lymphocytes Relative: 13 %
MCH: 30.6 pg (ref 26.0–34.0)
MCHC: 34.3 g/dL (ref 32.0–36.0)
MCV: 89.2 fL (ref 80.0–100.0)
MONO ABS: 0.9 10*3/uL (ref 0.2–0.9)
Monocytes Relative: 11 %
NEUTROS ABS: 5.8 10*3/uL (ref 1.4–6.5)
Neutrophils Relative %: 75 %
Platelets: 163 10*3/uL (ref 150–440)
RBC: 4.8 MIL/uL (ref 3.80–5.20)
RDW: 14.1 % (ref 11.5–14.5)
WBC: 7.8 10*3/uL (ref 3.6–11.0)

## 2016-03-28 LAB — BASIC METABOLIC PANEL
Anion gap: 10 (ref 5–15)
BUN: 8 mg/dL (ref 6–20)
CALCIUM: 8.5 mg/dL — AB (ref 8.9–10.3)
CO2: 29 mmol/L (ref 22–32)
CREATININE: 0.37 mg/dL — AB (ref 0.44–1.00)
Chloride: 97 mmol/L — ABNORMAL LOW (ref 101–111)
GFR calc non Af Amer: >60 mL/min (ref >60)
GLUCOSE: 143 mg/dL — AB (ref 65–99)
Potassium: 3.7 mmol/L (ref 3.5–5.1)
Sodium: 136 mmol/L (ref 135–145)

## 2016-03-28 LAB — RAPID INFLUENZA A&B ANTIGENS (ARMC ONLY)
INFLUENZA A (ARMC): NEGATIVE
INFLUENZA B (ARMC): NEGATIVE

## 2016-03-28 LAB — TROPONIN I
Troponin I: <0.03 ng/mL (ref <0.03)
Troponin I: 0.03 ng/mL (ref <0.03)
Troponin I: <0.03 ng/mL (ref <0.03)

## 2016-03-28 MED ORDER — GUAIFENESIN ER 600 MG PO TB12
600.0000 mg | ORAL_TABLET | Freq: Two times a day (BID) | ORAL | Status: DC
  Administered 2016-03-28 – 2016-04-01 (×8): 600 mg via ORAL
  Filled 2016-03-28 (×11): qty 1

## 2016-03-28 MED ORDER — SODIUM CHLORIDE 0.9 % IV BOLUS (SEPSIS)
500.0000 mL | Freq: Once | INTRAVENOUS | Status: AC
  Administered 2016-03-28: 500 mL via INTRAVENOUS

## 2016-03-28 MED ORDER — DEXTROSE 5 % IV SOLN
250.0000 mg | INTRAVENOUS | Status: DC
  Administered 2016-03-29 – 2016-03-30 (×2): 250 mg via INTRAVENOUS
  Filled 2016-03-28 (×2): qty 250

## 2016-03-28 MED ORDER — DEXTROSE 5 % IV SOLN: 1.0000 g | Freq: Once | INTRAVENOUS | Status: DC

## 2016-03-28 MED ORDER — ONDANSETRON HCL 4 MG PO TABS
4.0000 mg | ORAL_TABLET | Freq: Four times a day (QID) | ORAL | Status: DC | PRN
  Filled 2016-03-28: qty 1

## 2016-03-28 MED ORDER — METHYLPREDNISOLONE SODIUM SUCC 125 MG IJ SOLR
60.0000 mg | INTRAMUSCULAR | Status: DC
  Administered 2016-03-28: 60 mg via INTRAVENOUS
  Filled 2016-03-28: qty 2

## 2016-03-28 MED ORDER — SODIUM CHLORIDE 0.9 % IV BOLUS (SEPSIS)
500.0000 mL | Freq: Once | INTRAVENOUS | Status: AC
  Administered 2016-03-28: 12:00:00 via INTRAVENOUS

## 2016-03-28 MED ORDER — ACETAMINOPHEN 325 MG PO TABS: 650.0000 mg | ORAL_TABLET | Freq: Four times a day (QID) | ORAL | Status: DC | PRN

## 2016-03-28 MED ORDER — FLUTICASONE FUROATE-VILANTEROL 100-25 MCG/INH IN AEPB
1.0000 | INHALATION_SPRAY | RESPIRATORY_TRACT | Status: DC
  Administered 2016-03-29 – 2016-04-01 (×4): 1 via RESPIRATORY_TRACT
  Filled 2016-03-28 (×2): qty 28

## 2016-03-28 MED ORDER — NAPROXEN 500 MG PO TABS: 250.0000 mg | ORAL_TABLET | Freq: Two times a day (BID) | ORAL | Status: DC

## 2016-03-28 MED ORDER — AZITHROMYCIN 250 MG PO TABS
250.0000 mg | ORAL_TABLET | Freq: Once | ORAL | Status: AC
  Administered 2016-03-28: 250 mg via ORAL
  Filled 2016-03-28: qty 1

## 2016-03-28 MED ORDER — NAPROXEN SODIUM 220 MG PO TABS
220.0000 mg | ORAL_TABLET | Freq: Two times a day (BID) | ORAL | Status: DC
  Filled 2016-03-28: qty 1

## 2016-03-28 MED ORDER — ROPINIROLE HCL 1 MG PO TABS
1.0000 mg | ORAL_TABLET | Freq: Every day | ORAL | Status: DC
  Administered 2016-03-28 – 2016-03-31 (×4): 1 mg via ORAL
  Filled 2016-03-28 (×4): qty 1

## 2016-03-28 MED ORDER — ENOXAPARIN SODIUM 40 MG/0.4ML ~~LOC~~ SOLN
40.0000 mg | SUBCUTANEOUS | Status: DC
  Administered 2016-03-28: 40 mg via SUBCUTANEOUS
  Filled 2016-03-28: qty 0.4

## 2016-03-28 MED ORDER — ALBUTEROL SULFATE (2.5 MG/3ML) 0.083% IN NEBU
2.5000 mg | INHALATION_SOLUTION | RESPIRATORY_TRACT | Status: DC
  Filled 2016-03-28 (×2): qty 3

## 2016-03-28 MED ORDER — IPRATROPIUM-ALBUTEROL 0.5-2.5 (3) MG/3ML IN SOLN
3.0000 mL | Freq: Once | RESPIRATORY_TRACT | Status: AC
  Administered 2016-03-28: 3 mL via RESPIRATORY_TRACT
  Filled 2016-03-28: qty 3

## 2016-03-28 MED ORDER — DOCUSATE SODIUM 100 MG PO CAPS
100.0000 mg | ORAL_CAPSULE | Freq: Two times a day (BID) | ORAL | Status: DC
  Administered 2016-03-28 – 2016-03-31 (×6): 100 mg via ORAL
  Filled 2016-03-28 (×9): qty 1

## 2016-03-28 MED ORDER — TIOTROPIUM BROMIDE MONOHYDRATE 18 MCG IN CAPS
18.0000 ug | ORAL_CAPSULE | Freq: Every evening | RESPIRATORY_TRACT | Status: DC
  Administered 2016-03-28 – 2016-03-31 (×3): 18 ug via RESPIRATORY_TRACT
  Filled 2016-03-28: qty 5

## 2016-03-28 MED ORDER — CEFTRIAXONE SODIUM-DEXTROSE 1-3.74 GM-% IV SOLR
1.0000 g | INTRAVENOUS | Status: DC
  Administered 2016-03-29 – 2016-04-01 (×4): 1 g via INTRAVENOUS
  Filled 2016-03-28 (×5): qty 50

## 2016-03-28 MED ORDER — GABAPENTIN 300 MG PO CAPS
300.0000 mg | ORAL_CAPSULE | Freq: Every evening | ORAL | Status: DC
  Administered 2016-03-28 – 2016-03-31 (×4): 300 mg via ORAL
  Filled 2016-03-28 (×4): qty 1

## 2016-03-28 MED ORDER — IPRATROPIUM BROMIDE 0.02 % IN SOLN
0.5000 mg | RESPIRATORY_TRACT | Status: DC
  Filled 2016-03-28 (×2): qty 2.5

## 2016-03-28 MED ORDER — ENOXAPARIN SODIUM 40 MG/0.4ML ~~LOC~~ SOLN
40.0000 mg | SUBCUTANEOUS | Status: DC
  Administered 2016-03-29 – 2016-03-31 (×3): 40 mg via SUBCUTANEOUS
  Filled 2016-03-28 (×4): qty 0.4

## 2016-03-28 MED ORDER — CEFTRIAXONE SODIUM-DEXTROSE 1-3.74 GM-% IV SOLR
1.0000 g | Freq: Once | INTRAVENOUS | Status: AC
  Administered 2016-03-28: 1 g via INTRAVENOUS
  Filled 2016-03-28: qty 50

## 2016-03-28 MED ORDER — ALBUTEROL SULFATE (2.5 MG/3ML) 0.083% IN NEBU
10.0000 mg | INHALATION_SOLUTION | Freq: Once | RESPIRATORY_TRACT | Status: AC
  Administered 2016-03-28: 10 mg via RESPIRATORY_TRACT
  Filled 2016-03-28: qty 12

## 2016-03-28 MED ORDER — IPRATROPIUM-ALBUTEROL 0.5-2.5 (3) MG/3ML IN SOLN
3.0000 mL | RESPIRATORY_TRACT | Status: DC
  Administered 2016-03-28 – 2016-03-30 (×10): 3 mL via RESPIRATORY_TRACT
  Filled 2016-03-28 (×10): qty 3

## 2016-03-28 MED ORDER — LATANOPROST 0.005 % OP SOLN
1.0000 [drp] | Freq: Every evening | OPHTHALMIC | Status: DC
Start: 2016-03-28 — End: 2016-04-01
  Administered 2016-03-28 – 2016-03-31 (×4): 1 [drp] via OPHTHALMIC
  Filled 2016-03-28: qty 2.5

## 2016-03-28 MED ORDER — DIPHENHYDRAMINE HCL 25 MG PO TABS
25.0000 mg | ORAL_TABLET | Freq: Four times a day (QID) | ORAL | Status: DC
  Administered 2016-03-28: 25 mg via ORAL
  Filled 2016-03-28 (×6): qty 1

## 2016-03-28 MED ORDER — BUDESONIDE 0.25 MG/2ML IN SUSP
0.2500 mg | Freq: Two times a day (BID) | RESPIRATORY_TRACT | Status: DC
  Administered 2016-03-28 – 2016-04-01 (×9): 0.25 mg via RESPIRATORY_TRACT
  Filled 2016-03-28 (×10): qty 2

## 2016-03-28 MED ORDER — ONDANSETRON HCL 4 MG/2ML IJ SOLN: 4.0000 mg | Freq: Four times a day (QID) | INTRAMUSCULAR | Status: DC | PRN

## 2016-03-28 MED ORDER — METHYLPREDNISOLONE SODIUM SUCC 125 MG IJ SOLR
125.0000 mg | Freq: Once | INTRAMUSCULAR | Status: AC
  Administered 2016-03-28: 125 mg via INTRAVENOUS
  Filled 2016-03-28: qty 2

## 2016-03-28 MED ORDER — ACETAMINOPHEN 650 MG RE SUPP
650.0000 mg | Freq: Four times a day (QID) | RECTAL | Status: DC | PRN
  Filled 2016-03-28: qty 1

## 2016-03-28 MED ORDER — ACETAMINOPHEN-CODEINE #3 300-30 MG PO TABS: 1.0000 | ORAL_TABLET | Freq: Four times a day (QID) | ORAL | Status: DC | PRN

## 2016-03-28 MED ORDER — CEFTRIAXONE SODIUM 1 G IJ SOLR: 1.0000 g | INTRAMUSCULAR | Status: DC

## 2016-03-28 NOTE — H&P (Signed)
[place] at [place] NAME: Kristie [name]    MR#:  [id number]  DATE OF BIRTH:  10/16/1926  DATE OF ADMISSION:  03/28/2016  PRIMARY CARE PHYSICIAN: Leonel Ramsay, MD   REQUESTING/REFERRING PHYSICIAN:   CHIEF COMPLAINT:   Chief Complaint  Patient presents with  . Shortness of Breath    HISTORY OF PRESENT ILLNESS: Kristie [name]  is a 83 y.o. female with a known history of COPD,, chronic respiratory failure on 2 L of oxygen through nasal cannul at home, glaucoma, restless leg syndrome, status post right lung removal due to squamous cell carcinoma, who presents to the hospital with complaints of cough for the past 4 days, green thick phlegm, wheezing, shortness of breath. On arrival to the hospital patient was noted to be hypoxic, requiring 4 L of oxygen nasal cannula with saturations in 90s. She received Solu Medrol, nebulizing therapy. However, her condition did not improve significantly and hospitalist services were contacted for admission. Patient admits of feeling feverish, fatigued and weak, she admits of recent weight loss, some nasal drip apart from cough, wheezing and shortness of breath  PAST MEDICAL HISTORY:   Past Medical History:  Diagnosis Date  . COPD (chronic obstructive pulmonary disease) ([place])   . Glaucoma   . Restless legs     PAST SURGICAL HISTORY: Past Surgical History:  Procedure Laterality Date  . APPENDECTOMY    . LUNG REMOVAL, PARTIAL Right    squamous cell carcinoma, all lobes on the right were removed.  . TONSILLECTOMY      SOCIAL HISTORY:  Social History  Substance Use Topics  . Smoking status: Former Research scientist (life sciences)  . Smokeless tobacco: Never Used  . Alcohol use Yes     Comment: occasionally    FAMILY HISTORY:  Family History  Problem Relation Age of Onset  . Bladder Cancer Mother   . CAD Father     DRUG ALLERGIES: No Known Allergies  Review of Systems  Constitutional: Positive for fever.  Negative for chills and weight loss.  HENT: Positive for congestion and sinus pain.   Eyes: Negative for blurred vision and double vision.  Respiratory: Positive for cough, sputum production, shortness of breath and wheezing.   Cardiovascular: Positive for palpitations. Negative for chest pain, orthopnea, leg swelling and PND.  Gastrointestinal: Negative for abdominal pain, blood in stool, constipation, diarrhea, nausea and vomiting.  Genitourinary: Negative for dysuria, frequency, hematuria and urgency.  Musculoskeletal: Negative for falls.  Neurological: Positive for weakness. Negative for dizziness, tremors, focal weakness and headaches.  Endo/Heme/Allergies: Does not bruise/bleed easily.  Psychiatric/Behavioral: Negative for depression. The patient does not have insomnia.     MEDICATIONS AT HOME:  Prior to Admission medications   Medication Sig Start Date End Date Taking? Authorizing Provider  BREO ELLIPTA 100-25 MCG/INH AEPB Inhale 1 puff into the lungs every morning. 11/22/14  Yes Historical Provider, MD  chlorpheniramine (CHLOR-TRIMETON) 4 MG tablet Take 4 mg by mouth every evening.   Yes Historical Provider, MD  gabapentin (NEURONTIN) 300 MG capsule Take 300 mg by mouth every evening. 11/05/14  Yes Historical Provider, MD  latanoprost (XALATAN) 0.005 % ophthalmic solution Place 1 drop into both eyes every evening. 10/01/14  Yes Historical Provider, MD  Magnesium 250 MG TABS Take 250 mg by mouth daily with breakfast.   Yes Historical Provider, MD  Multiple Vitamin (MULTI VITAMIN DAILY PO) Take 1 tablet by mouth daily.   Yes Historical Provider, MD  rOPINIRole (REQUIP)  1 MG tablet Take 1 mg by mouth at bedtime. 09/29/14  Yes Historical Provider, MD  SPIRIVA HANDIHALER 18 MCG inhalation capsule Place 18 mcg into inhaler and inhale every evening. 11/13/14  Yes Historical Provider, MD  acetaminophen-codeine (TYLENOL #3) 300-30 MG per tablet Take 1 tablet by mouth every 6 (six) hours as needed  for moderate pain. 11/28/14   [name], MD  naproxen sodium (ALEVE) 220 MG tablet Take 220 mg by mouth 2 (two) times daily with a meal.    Historical Provider, MD      PHYSICAL EXAMINATION:   VITAL SIGNS: Blood pressure 104/78, pulse (!) 115, temperature 98.4 F (36.9 C), temperature source Oral, resp. rate (!) 26, height '5\' 4"'$  (1.626 m), weight 59 kg (130 lb), SpO2 92 %.  GENERAL:  83 y.o.-year-old patient lying in the bed In moderate respiratory distress, tachypneic, uncomfortable, pursed lip breathing, using accessory muscles to breathe.  EYES: Pupils equal, round, reactive to light and accommodation. No scleral icterus. Extraocular muscles intact.  HEENT: Head atraumatic, normocephalic. Oropharynx and nasopharynx clear.  NECK:  Supple, no jugular venous distention. No thyroid enlargement, no tenderness.  LUNGS: Some diminished breath sounds on the left, scattered wheezing, rales,rhonchi , but no crepitations. Using accessory muscles of respiration at rest, coughing intermittently with no significant sputum production, rattling in the chest.  CARDIOVASCULAR: S1, S2 , tachycardic, distant, intermittently irregular. No murmurs, rubs, or gallops.  ABDOMEN: Soft, nontender, nondistended. Bowel sounds present. No organomegaly or mass.  EXTREMITIES: No pedal edema, cyanosis, or clubbing.  NEUROLOGIC: Cranial nerves II through XII are intact. Muscle strength 5/5 in all extremities. Sensation intact. Gait not checked.  PSYCHIATRIC: The patient is alert and oriented x 3.  SKIN: No obvious rash, lesion, or ulcer.   LABORATORY PANEL:   CBC  Recent Labs Lab 03/28/16 0939  WBC 7.8  HGB 14.7  HCT 42.9  PLT 163  MCV 89.2  MCH 30.6  MCHC 34.3  RDW 14.1  LYMPHSABS 1.0  MONOABS 0.9  EOSABS 0.0  BASOSABS 0.1   ------------------------------------------------------------------------------------------------------------------  Chemistries   Recent Labs Lab 03/28/16 0939   NA 136  K 3.7  CL 97*  CO2 29  GLUCOSE 143*  BUN 8  CREATININE 0.37*  CALCIUM 8.5*   ------------------------------------------------------------------------------------------------------------------  Cardiac Enzymes  Recent Labs Lab 03/28/16 0939  TROPONINI <0.03   ------------------------------------------------------------------------------------------------------------------  RADIOLOGY: Dg Chest Portable 1 View  Result Date: 03/28/2016 CLINICAL DATA:  Worsening shortness of breath since 03/24/2016. EXAM: PORTABLE CHEST 1 VIEW COMPARISON:  PA and lateral chest 02/12/2016 and 07/04/2014. FINDINGS: Complete white out of the right hemithorax and volume loss consistent prior pneumonectomy again seen. Streaky left basilar airspace disease is identified. Cardiac silhouette is obscured. Aortic atherosclerosis is noted. IMPRESSION: Streaky left basilar airspace disease could be atelectasis or pneumonia. Status post right pneumonectomy. Electronically Signed   By: Inge Rise M.D.   On: 03/28/2016 09:56    EKG: Orders placed or performed during the hospital encounter of 03/28/16  . ED EKG  . ED EKG  . EKG 12-Lead  . EKG 12-Lead   JG in emergency room revealed sinus tachycardia at rate of 99, supraventricular bigeminy, right axis deviation, low-voltage QRS, ST depressions in numerous leads, no acute distress. CT changes   IMPRESSION AND PLAN:  Active Problems:   COPD exacerbation ([facility])   Acute on chronic respiratory failure with hypoxia ([facility])   Acute bronchitis #1. Acute on chronic respiratory failure with hypoxia, continue patient on oxygen  therapy, wean off to her baseline 2 L of oxygen as tolerated #2. COPD exacerbation. Continue systemic steroids, budesonide, duo nebs, oxygen, follow clinically, get pulmonary consultation per patient's request #3. Community acquired pneumonia, initiate patient on the Rocephin and Zithromax, follow clinically #4. Hyperglycemia, get  hemoglobin A1c to rule out diabetes   All the records are reviewed and case discussed with ED provider. Management plans discussed with the patient, family and they are in agreement.  CODE STATUS: Code Status History    Date Active Date Inactive Code Status Order ID Comments User Context   02/12/2016  2:27 PM 02/15/2016  1:29 PM DNR [id number]  Vaughan Basta, MD Inpatient    Questions for Most Recent Historical Code Status (Order [id number])    Question Answer Comment   In the event of cardiac or respiratory ARREST Do not call a "code blue"    In the event of cardiac or respiratory ARREST Do not perform Intubation, CPR, defibrillation or ACLS    In the event of cardiac or respiratory ARREST Use medication by any route, position, wound care, and other measures to relive pain and suffering. May use oxygen, suction and manual treatment of airway obstruction as needed for comfort.         Advance Directive Documentation   Flowsheet Row Most Recent Value  Type of Advance Directive  Out of facility DNR (pink MOST or yellow form)  Pre-existing out of facility DNR order (yellow form or pink MOST form)  Yellow form placed in chart (order not valid for inpatient use)  "MOST" Form in Place?  No data       TOTAL TIME TAKING CARE OF THIS PATIENT: 50 minutes.    Theodoro Grist M.D on 03/28/2016 at 11:37 AM  Between 7am to 6pm - Pager - [phone] After 6pm go to [email or web address] - password [email or web address] [facility]  [place] [facility]  Office  [phone]  CC: Primary care physician; Leonel Ramsay, MD

## 2016-03-28 NOTE — ED Provider Notes (Signed)
[facility] Emergency Department Provider Note  ____________________________________________  Time seen: Approximately 9:20 AM  I have reviewed the triage vital signs and the nursing notes.   HISTORY  Chief Complaint Shortness of Breath   HPI Kristie [name] is a 83 y.o. female with a history of COPD on 2L [place] who presents for evaluation of shortness of breath. Patient reports 5 days of cough productive of yellow sputum and progressively worsening shortness of breath and wheezing. She was started on a Z-Pak 2 days ago. Today she reports that her shortness of breath got worse. She is still coughing. Had a temp of 100.66F this morning. No chest pain, vomiting, nausea, diarrhea, abdominal pain. Flu and pneumonia vaccines are up to date. When EMS arrived to the facility patient was satting in the mid 80s on her baseline 2 L oxygen. She was given to do an abs in route. Patient stopped smoking many years ago. No leg pain or swelling, no hemoptysis, no prior personal or family history of blood clots.  Past Medical History:  Diagnosis Date  . COPD (chronic obstructive pulmonary disease) ([place])   . Glaucoma   . Restless legs     Patient Active Problem List   Diagnosis Date Noted  . COPD exacerbation ([place]) 02/12/2016    Past Surgical History:  Procedure Laterality Date  . APPENDECTOMY    . LUNG REMOVAL, PARTIAL Right    squamous cell carcinoma, all lobes on the right were removed.  . TONSILLECTOMY      Prior to Admission medications   Medication Sig Start Date End Date Taking? Authorizing Provider  BREO ELLIPTA 100-25 MCG/INH AEPB Inhale 1 puff into the lungs every morning. 11/22/14  Yes Historical Provider, MD  chlorpheniramine (CHLOR-TRIMETON) 4 MG tablet Take 4 mg by mouth every evening.   Yes Historical Provider, MD  gabapentin (NEURONTIN) 300 MG capsule Take 300 mg by mouth every evening. 11/05/14  Yes Historical Provider, MD  latanoprost (XALATAN) 0.005 %  ophthalmic solution Place 1 drop into both eyes every evening. 10/01/14  Yes Historical Provider, MD  Magnesium 250 MG TABS Take 250 mg by mouth daily with breakfast.   Yes Historical Provider, MD  Multiple Vitamin (MULTI VITAMIN DAILY PO) Take 1 tablet by mouth daily.   Yes Historical Provider, MD  rOPINIRole (REQUIP) 1 MG tablet Take 1 mg by mouth at bedtime. 09/29/14  Yes Historical Provider, MD  SPIRIVA HANDIHALER 18 MCG inhalation capsule Place 18 mcg into inhaler and inhale every evening. 11/13/14  Yes Historical Provider, MD  acetaminophen-codeine (TYLENOL #3) 300-30 MG per tablet Take 1 tablet by mouth every 6 (six) hours as needed for moderate pain. 11/28/14   [name], MD  naproxen sodium (ALEVE) 220 MG tablet Take 220 mg by mouth 2 (two) times daily with a meal.    Historical Provider, MD    Allergies Patient has no known allergies.  Family History  Problem Relation Age of Onset  . Bladder Cancer Mother   . CAD Father     Social History Social History  Substance Use Topics  . Smoking status: Former Research scientist (life sciences)  . Smokeless tobacco: Never Used  . Alcohol use Yes     Comment: occasionally    Review of Systems  Constitutional: Negative for fever. Eyes: Negative for visual changes. ENT: Negative for sore throat. Neck: No neck pain  Cardiovascular: Negative for chest pain. Respiratory: + shortness of breath, wheezing, cough Gastrointestinal: Negative for abdominal pain, vomiting or diarrhea.  Genitourinary: Negative for dysuria. Musculoskeletal: Negative for back pain. Skin: Negative for rash. Neurological: Negative for headaches, weakness or numbness. Psych: No SI or HI  ____________________________________________   PHYSICAL EXAM:  VITAL SIGNS: ED Triage Vitals  Enc Vitals Group     BP 03/28/16 0915 (!) 160/113     Pulse Rate 03/28/16 0915 (!) 124     Resp 03/28/16 0915 (!) 26     Temp 03/28/16 0915 98.4 F (36.9 C)     Temp Source 03/28/16 0915  Oral     SpO2 03/28/16 0915 (!) 86 %     Weight 03/28/16 0916 130 lb (59 kg)     Height 03/28/16 0916 '5\' 4"'$  (1.626 m)     Head Circumference --      Peak Flow --      Pain Score --      Pain Loc --      Pain Edu? --      Excl. in [place]? --     Constitutional: Alert and oriented. Well appearing and in no apparent distress. HEENT:      Head: Normocephalic and atraumatic.         Eyes: Conjunctivae are normal. Sclera is non-icteric. EOMI. PERRL      Mouth/Throat: Mucous membranes are moist.       Neck: Supple with no signs of meningismus. Cardiovascular: Tachycardic with regular rhythm. No murmurs, gallops, or rubs. 2+ symmetrical distal pulses are present in all extremities. No JVD. Respiratory: Increased work of breathing, tachypnea, satting in the mid 80s on 2 L nasal cannula, diffuse expiratory wheezes throughout  Gastrointestinal: Soft, non tender, and non distended with positive bowel sounds. No rebound or guarding. Musculoskeletal: Nontender with normal range of motion in all extremities. No edema, cyanosis, or erythema of extremities. Neurologic: Normal speech and language. Face is symmetric. Moving all extremities. No gross focal neurologic deficits are appreciated. Skin: Skin is warm, dry and intact. No rash noted. Psychiatric: Mood and affect are normal. Speech and behavior are normal.  ____________________________________________   LABS (all labs ordered are listed, but only abnormal results are displayed)  Labs Reviewed  BASIC METABOLIC PANEL - Abnormal; Notable for the following:       Result Value   Chloride 97 (*)    Glucose, Bld 143 (*)    Creatinine, Ser 0.37 (*)    Calcium 8.5 (*)    All other components within normal limits  BLOOD GAS, VENOUS - Abnormal; Notable for the following:    pO2, Ven 59.0 (*)    All other components within normal limits  RAPID INFLUENZA A&B ANTIGENS ([facility] ONLY)  TROPONIN I  CBC WITH DIFFERENTIAL/PLATELET  TROPONIN I    ____________________________________________  EKG   ED ECG REPORT I, Rudene Re, the attending physician, personally viewed and interpreted this ECG.  Sinus tachycardia, rate of 119, normal intervals, right axis deviation, no ST elevations or depressions.  ____________________________________________  RADIOLOGY  CXR:  Streaky left basilar airspace disease could be atelectasis or Pneumonia. Status post right pneumonectomy. ____________________________________________   PROCEDURES  Procedure(s) performed: None Procedures Critical Care performed: yes  CRITICAL CARE Performed by: Rudene Re  ?  Total critical care time: 35 min  Critical care time was exclusive of separately billable procedures and treating other patients.  Critical care was necessary to treat or prevent imminent or life-threatening deterioration.  Critical care was time spent personally by me on the following activities: development of treatment plan with patient and/or surrogate as  well as nursing, discussions with consultants, evaluation of patient's response to treatment, examination of patient, obtaining history from patient or surrogate, ordering and performing treatments and interventions, ordering and review of laboratory studies, ordering and review of radiographic studies, pulse oximetry and re-evaluation of patient's condition.  ____________________________________________   INITIAL IMPRESSION / ASSESSMENT AND PLAN / ED COURSE  83 y.o. female with a history of COPD on 2L [place] who presents for evaluation of shortness of breath, wheezing and coughing, fever this am after 2 days of z-pack. Patient is tachycardic however has received her to do nebs per EMS, she has increased work of breathing, audible wheezes, wheezes on auscultation bilaterally, she is hypoxic to the mid 80s on her baseline 2 L nasal cannula. She is afebrile here. We'll give 3 doing labs and IV Solu-Medrol. We'll check for  flu, chest x-ray, EKG, troponin, blood work.  Clinical Course as of Mar 28 1050  Thu Mar 28, 2016  1051 Patient continues to wheeze after 3 duo nebs. Will start patient on 10 mg of continuous albuterol. She continues to complain of shortness of breath and is requiring 4 L of nasal cannula oxygen to have sats above 90% (patient's baseline is 2 L). Chest x-ray concerning for pneumonia and patient is already on Z-Pak therefore patient failed outpatient therapy. I broaden her coverage with IV ceftriaxone for community-acquired pneumonia. Her VBG is within normal limits and so is her kidney function. I discussed with the hospitalist for admission.  [CV]    Clinical Course User Index [CV] Rudene Re, MD    Pertinent labs & imaging results that were available during my care of the patient were reviewed by me and considered in my medical decision making (see chart for details).    ____________________________________________   FINAL CLINICAL IMPRESSION(S) / ED DIAGNOSES  Final diagnoses:  Acute respiratory failure with hypoxia ([place])  Community acquired pneumonia, unspecified laterality      NEW MEDICATIONS STARTED DURING THIS VISIT:  New Prescriptions   No medications on file     Note:  This document was prepared using Dragon voice recognition software and may include unintentional dictation errors.    Rudene Re, MD 03/28/16 1052

## 2016-03-28 NOTE — ED Triage Notes (Signed)
Pt comes into the ED via EMS from independent living at [facility] with c/o increased SOB since Sunday, states she was dx with URI 2 days ago and started on a zpack, pt is on [facility] continous.. Pt was given 2 duonebs in route by EMS.. Pt c/o congested cough that she not able to get the sputum up and out.Marland Kitchen

## 2016-03-29 LAB — BASIC METABOLIC PANEL
ANION GAP: 7 (ref 5–15)
BUN: 11 mg/dL (ref 6–20)
CALCIUM: 8.5 mg/dL — AB (ref 8.9–10.3)
CO2: 34 mmol/L — ABNORMAL HIGH (ref 22–32)
Chloride: 103 mmol/L (ref 101–111)
Creatinine, Ser: 0.43 mg/dL — ABNORMAL LOW (ref 0.44–1.00)
Glucose, Bld: 163 mg/dL — ABNORMAL HIGH (ref 65–99)
Potassium: 3.6 mmol/L (ref 3.5–5.1)
SODIUM: 144 mmol/L (ref 135–145)

## 2016-03-29 LAB — HEMOGLOBIN A1C
HEMOGLOBIN A1C: 5.5 % (ref 4.8–5.6)
MEAN PLASMA GLUCOSE: 111 mg/dL

## 2016-03-29 LAB — CBC
HCT: 41.5 % (ref 35.0–47.0)
HEMOGLOBIN: 14.3 g/dL (ref 12.0–16.0)
MCH: 31.3 pg (ref 26.0–34.0)
MCHC: 34.6 g/dL (ref 32.0–36.0)
MCV: 90.4 fL (ref 80.0–100.0)
Platelets: 164 10*3/uL (ref 150–440)
RBC: 4.59 MIL/uL (ref 3.80–5.20)
RDW: 14.2 % (ref 11.5–14.5)
WBC: 5.1 10*3/uL (ref 3.6–11.0)

## 2016-03-29 LAB — GLUCOSE, CAPILLARY: GLUCOSE-CAPILLARY: 148 mg/dL — AB (ref 65–99)

## 2016-03-29 LAB — PROCALCITONIN

## 2016-03-29 MED ORDER — GUAIFENESIN 100 MG/5ML PO SOLN
15.0000 mL | ORAL | Status: DC | PRN
  Administered 2016-03-29: 300 mg via ORAL
  Filled 2016-03-29: qty 20
  Filled 2016-03-29: qty 15
  Filled 2016-03-29: qty 20

## 2016-03-29 MED ORDER — METHYLPREDNISOLONE SODIUM SUCC 40 MG IJ SOLR
40.0000 mg | Freq: Two times a day (BID) | INTRAMUSCULAR | Status: DC
  Administered 2016-03-29 – 2016-03-31 (×5): 40 mg via INTRAVENOUS
  Filled 2016-03-29 (×5): qty 1

## 2016-03-29 MED ORDER — PHENOL 1.4 % MT LIQD
1.0000 | OROMUCOSAL | Status: DC | PRN
  Administered 2016-03-29 – 2016-03-31 (×7): 1 via OROMUCOSAL
  Filled 2016-03-29: qty 177

## 2016-03-29 MED ORDER — BENZONATATE 100 MG PO CAPS
200.0000 mg | ORAL_CAPSULE | Freq: Three times a day (TID) | ORAL | Status: DC | PRN
  Administered 2016-03-29: 200 mg via ORAL
  Filled 2016-03-29: qty 2

## 2016-03-29 MED ORDER — DIPHENHYDRAMINE HCL 25 MG PO TABS
25.0000 mg | ORAL_TABLET | Freq: Every day | ORAL | Status: DC
  Administered 2016-03-29 – 2016-03-31 (×3): 25 mg via ORAL
  Filled 2016-03-29 (×6): qty 1

## 2016-03-29 NOTE — Care Management (Signed)
Admitted to this facility with the diagnosis of acute on chronic respiratory failure. Lives with husband, Delfino Lovett. [phone]). Lives at [place] x 12 years. Home health and physical therapy ordered when discharged from this facility 02/15/16. [facility] staff was to arrange these services. Sees Dr. Ola Spurr as primary are physician. Cane in the home to use, if needed. Prescriptions are filled at [facility] [place] care of all basic activities of daily living herself.  Shelbie Ammons RN MSN CCM Care Management

## 2016-03-29 NOTE — Evaluation (Signed)
Physical Therapy Evaluation Patient Details Name: Kristie [name] MRN: [id number] DOB: 05/06/1926 Today's Date: 03/29/2016   History of Present Illness  Pt is a 83 y/o F who presents with c/o cough, wheezing, and SOB.  Pt admitted with acute COPD exacerbation.  Pt's PMH includes COPD, glaucoma, restless leg syndrome.    Clinical Impression  Pt admitted with above diagnosis. Pt currently with functional limitations due to the deficits listed below (see PT Problem List). Kristie [name] was Ind with all mobility and ADLs PTA and denies any falls in the past 6 months.  She was steady with transfers and ambulation today with supervision provided for safety.  Her main limitation appears to be her pulmonary status, and thus recommending Pulmonary Rehab at d/c.    Follow Up Recommendations No PT follow up    Equipment Recommendations  None recommended by PT    Recommendations for Other Services Other (comment) (Pulmonary Rehab)     Precautions / Restrictions Precautions Precautions: Other (comment) Precaution Comments: monitor O2 Restrictions Weight Bearing Restrictions: No      Mobility  Bed Mobility               General bed mobility comments: Pt sitting in chair upon PT arrival  Transfers Overall transfer level: Modified independent Equipment used: None             General transfer comment: Pt steady but slow to stand  Ambulation/Gait Ambulation/Gait assistance: Supervision Ambulation Distance (Feet): 60 Feet Assistive device: None Gait Pattern/deviations: Step-through pattern;Decreased stride length     General Gait Details: Pt steady but quickly reports SOB after ambulating 30 ft. and requests to turn around.  SpO2 as low as 89% on 3L O2.  Cues for pursed lip breathing which pt has difficulty performing correctly.  Stairs            Wheelchair Mobility    Modified Rankin (Stroke Patients Only)       Balance Overall balance assessment: Needs  assistance Sitting-balance support: No upper extremity supported;Feet supported Sitting balance-Leahy Scale: Normal     Standing balance support: No upper extremity supported;During functional activity Standing balance-Leahy Scale: Good                               Pertinent Vitals/Pain Pain Assessment: No/denies pain    Home Living Family/patient expects to be discharged to:: Private residence Living Arrangements: Spouse/significant other Available Help at Discharge: Family;Available 24 hours/day Type of Home: Independent living facility Home Access: Stairs to enter Entrance Stairs-Rails: Left;Right;Can reach both Entrance Stairs-Number of Steps: 2 Home Layout: One level Home Equipment: Cane - single point;Grab bars - tub/shower;Grab bars - toilet      Prior Function Level of Independence: Independent         Comments: After last admission in 11/17 pt received ~2 sessions of HHPT before being d/c from this service.  Pt ambulating independently at home without any falls in the past 6 months.       Hand Dominance        Extremity/Trunk Assessment   Upper Extremity Assessment Upper Extremity Assessment: Overall WFL for tasks assessed    Lower Extremity Assessment Lower Extremity Assessment: Overall WFL for tasks assessed       Communication   Communication: No difficulties  Cognition Arousal/Alertness: Awake/alert Behavior During Therapy: WFL for tasks assessed/performed Overall Cognitive Status: Within Functional Limits for tasks assessed  General Comments      Exercises General Exercises - Upper Extremity Shoulder Flexion: AROM;Both;10 reps;Seated Other Exercises Other Exercises: Encouraged pt to ambulate at least 3x/day with nursing staff   Assessment/Plan    PT Assessment Patient needs continued PT services  PT Problem List Decreased activity tolerance;Cardiopulmonary status limiting activity           PT Treatment Interventions Gait training;Therapeutic exercise;Balance training;Patient/family education;Other (comment) (energy conservation techniques)    PT Goals (Current goals can be found in the Care Plan section)  Acute Rehab PT Goals Patient Stated Goal: to go home and to motivate herself to go to Pulmonary Rehab PT Goal Formulation: With patient Time For Goal Achievement: 04/12/16 Potential to Achieve Goals: Good    Frequency Min 2X/week   Barriers to discharge        Co-evaluation               End of Session Equipment Utilized During Treatment: Gait belt;Oxygen Activity Tolerance: Other (comment) (limited by SOB) Patient left: in chair;with call bell/phone within reach;with chair alarm set Nurse Communication: Mobility status;Other (comment) (SpO2)         Time: 8032-1224 PT Time Calculation (min) (ACUTE ONLY): 17 min   Charges:   PT Evaluation $PT Eval Low Complexity: 1 Procedure     PT G Codes:        Collie Siad PT, DPT 03/29/2016, 2:11 PM

## 2016-03-29 NOTE — Progress Notes (Signed)
[place] [facility] [place] NAME: Kristie [name]    MR#:  [id number]  DATE OF BIRTH:  04/02/1926  SUBJECTIVE:    Patient feels SOB this am but overall improved since admission  REVIEW OF SYSTEMS:    Review of Systems  Constitutional: Negative.  Negative for chills, fever and malaise/fatigue.  HENT: Negative.  Negative for ear discharge, ear pain, hearing loss, nosebleeds and sore throat.   Eyes: Negative.  Negative for blurred vision and pain.  Respiratory: Positive for cough, shortness of breath and wheezing. Negative for hemoptysis.   Cardiovascular: Negative.  Negative for chest pain, palpitations and leg swelling.  Gastrointestinal: Negative.  Negative for abdominal pain, blood in stool, diarrhea, nausea and vomiting.  Genitourinary: Negative.  Negative for dysuria.  Musculoskeletal: Negative.  Negative for back pain.  Skin: Negative.   Neurological: Negative for dizziness, tremors, speech change, focal weakness, seizures and headaches.  Endo/Heme/Allergies: Negative.  Does not bruise/bleed easily.  Psychiatric/Behavioral: Negative.  Negative for depression, hallucinations and suicidal ideas.    Tolerating Diet: yes      DRUG ALLERGIES:  No Known Allergies  VITALS:  Blood pressure (!) 111/51, pulse (!) 104, temperature 97.8 F (36.6 C), temperature source Oral, resp. rate 18, height '5\' 4"'$  (1.626 m), weight 59.5 kg (131 lb 4 oz), SpO2 93 %.  PHYSICAL EXAMINATION:   Physical Exam  Constitutional: She is oriented to person, place, and time and well-developed, well-nourished, and in no distress. No distress.  HENT:  Head: Normocephalic.  Eyes: No scleral icterus.  Neck: Normal range of motion. Neck supple. No JVD present. No tracheal deviation present.  Cardiovascular: Normal rate, regular rhythm and normal heart sounds.  Exam reveals no gallop and no friction rub.   No murmur heard. Pulmonary/Chest: Effort normal. No respiratory  distress. She has wheezes. She has no rales. She exhibits no tenderness.  Abdominal: Soft. Bowel sounds are normal. She exhibits no distension and no mass. There is no tenderness. There is no rebound and no guarding.  Musculoskeletal: Normal range of motion. She exhibits no edema.  Neurological: She is alert and oriented to person, place, and time.  Skin: Skin is warm. No rash noted. No erythema.  Psychiatric: Affect and judgment normal.      LABORATORY PANEL:   CBC  Recent Labs Lab 03/29/16 0420  WBC 5.1  HGB 14.3  HCT 41.5  PLT 164   ------------------------------------------------------------------------------------------------------------------  Chemistries   Recent Labs Lab 03/29/16 0420  NA 144  K 3.6  CL 103  CO2 34*  GLUCOSE 163*  BUN 11  CREATININE 0.43*  CALCIUM 8.5*   ------------------------------------------------------------------------------------------------------------------  Cardiac Enzymes  Recent Labs Lab 03/28/16 0939 03/28/16 1413 03/28/16 1945  TROPONINI <0.03 0.03* <0.03   ------------------------------------------------------------------------------------------------------------------  RADIOLOGY:  Dg Chest Portable 1 View  Result Date: 03/28/2016 CLINICAL DATA:  Worsening shortness of breath since 03/24/2016. EXAM: PORTABLE CHEST 1 VIEW COMPARISON:  PA and lateral chest 02/12/2016 and 07/04/2014. FINDINGS: Complete white out of the right hemithorax and volume loss consistent prior pneumonectomy again seen. Streaky left basilar airspace disease is identified. Cardiac silhouette is obscured. Aortic atherosclerosis is noted. IMPRESSION: Streaky left basilar airspace disease could be atelectasis or pneumonia. Status post right pneumonectomy. Electronically Signed   By: Inge Rise M.D.   On: 03/28/2016 09:56     ASSESSMENT AND PLAN:   83 year-old Female who presents with shortness of breath and found to have community-acquired  pneumonia and COPD exacerbation.  1. Acute on chronic toxic respiratory failure on baseline 2 L of oxygen: Patient being weaned to baseline 2 L of oxygen  Treatment for pneumonia and COPD as outlined below  2. Acute COPD exacerbation: Increase steroids to 40 IV every 12. Continue inhalers and DuoNeb's  3. Community acquired pneumonia: Continue Rocephin and azithromycin. Follow-up on final blood cultures   4. Restless leg syndrome on Requip  5. Cataracts continue eyedrops  Management plans discussed with the patient and she is in agreement.  CODE STATUS: DNR  TOTAL TIME TAKING CARE OF THIS PATIENT: 30 minutes.   PT consult for disposition  POSSIBLE D/C 1-2 days, DEPENDING ON CLINICAL CONDITION.   Chad Donoghue M.D on 03/29/2016 at 8:19 AM  Between 7am to 6pm - Pager - [phone] After 6pm go to [email or web address] - password [email or web address]  Office  [phone]  CC: Primary care physician; FITZGERALD, DAVID [name], MD  Note: This dictation was prepared with Dragon dictation along with smaller phrase technology. Any transcriptional errors that result from this process are unintentional.

## 2016-03-30 LAB — GLUCOSE, CAPILLARY: Glucose-Capillary: 140 mg/dL — ABNORMAL HIGH (ref 65–99)

## 2016-03-30 LAB — EXPECTORATED SPUTUM ASSESSMENT W REFEX TO RESP CULTURE

## 2016-03-30 LAB — EXPECTORATED SPUTUM ASSESSMENT W GRAM STAIN, RFLX TO RESP C

## 2016-03-30 MED ORDER — UAC/UVC NICU FLUSH (1/4 NS + HEPARIN 0.5 UNIT/ML): 0.5000 mL | INJECTION | INTRAVENOUS | Status: DC | PRN

## 2016-03-30 MED ORDER — SODIUM CHLORIDE 0.9% FLUSH
3.0000 mL | Freq: Two times a day (BID) | INTRAVENOUS | Status: DC
  Administered 2016-03-30 – 2016-04-01 (×6): 3 mL via INTRAVENOUS

## 2016-03-30 MED ORDER — IPRATROPIUM-ALBUTEROL 0.5-2.5 (3) MG/3ML IN SOLN
3.0000 mL | Freq: Four times a day (QID) | RESPIRATORY_TRACT | Status: DC
  Administered 2016-03-30 – 2016-04-01 (×8): 3 mL via RESPIRATORY_TRACT
  Filled 2016-03-30 (×8): qty 3

## 2016-03-30 MED ORDER — AZITHROMYCIN 250 MG PO TABS
250.0000 mg | ORAL_TABLET | Freq: Every day | ORAL | Status: AC
  Administered 2016-03-31 – 2016-04-01 (×2): 250 mg via ORAL
  Filled 2016-03-30 (×2): qty 1

## 2016-03-30 MED ORDER — SODIUM CHLORIDE 0.9% FLUSH
3.0000 mL | INTRAVENOUS | Status: DC | PRN
  Administered 2016-03-30 (×2): 3 mL via INTRAVENOUS
  Filled 2016-03-30 (×2): qty 3

## 2016-03-30 NOTE — Consult Note (Signed)
Pulmonary Critical Care  Initial Consult Note   Kristie [name] YKZ:[id number] DOB: January 20, 1927 DOA: 03/28/2016  Referring physician: Bettey Costa PCP: Leonel Ramsay, MD   Chief Complaint:  COPD exacerbation  HPI: Kristie [name] is a 83 y.o. female  Past medical history of COPD presented to the hospital with increasing shortness of breath.  She is chronically oxygen-dependent known to me from the office.  PAtient has a history of squamous cell carcinoma and is status post pneumonectomy.  She has been noting increased cough with sputum production.  She has had no hemoptysis noted.  She had saturations in the 90% range.  Since her admission she states she is feeling a little bit better however she is not quite back to baseline.  She denies having any chest pain no palpitations.  No fevers are noted.   Review of Systems:  Constitutional:  No weight loss, night sweats, Fevers, chills, fatigue.  HEENT:  No headaches, nasal congestion, post nasal drip,  Cardio-vascular:  No chest pain, anasarca, dizziness, palpitations  GI:  No heartburn, indigestion, abdominal pain, nausea, vomiting, diarrhea  Resp:  +shortness of breath. +productive cough, +coughing up of blood.+ wheezing Skin:  no rash or lesions.  Musculoskeletal:  No joint pain or swelling.   Remainder ROS performed and is unremarkable other than noted in HPI  Past Medical History:  Diagnosis Date  . COPD (chronic obstructive pulmonary disease) ([place])   . Glaucoma   . Restless legs    Past Surgical History:  Procedure Laterality Date  . APPENDECTOMY    . LUNG REMOVAL, PARTIAL Right    squamous cell carcinoma, all lobes on the right were removed.  . TONSILLECTOMY     Social History:  reports that she has quit smoking. She has never used smokeless tobacco. She reports that she drinks alcohol. Her drug history is not on file.  No Known Allergies  Family History  Problem Relation Age of Onset  . Bladder Cancer Mother   .  CAD Father     Prior to Admission medications   Medication Sig Start Date End Date Taking? Authorizing Provider  BREO ELLIPTA 100-25 MCG/INH AEPB Inhale 1 puff into the lungs every morning. 11/22/14  Yes Historical Provider, MD  chlorpheniramine (CHLOR-TRIMETON) 4 MG tablet Take 4 mg by mouth every evening.   Yes Historical Provider, MD  gabapentin (NEURONTIN) 300 MG capsule Take 300 mg by mouth every evening. 11/05/14  Yes Historical Provider, MD  latanoprost (XALATAN) 0.005 % ophthalmic solution Place 1 drop into both eyes every evening. 10/01/14  Yes Historical Provider, MD  Magnesium 250 MG TABS Take 250 mg by mouth daily with breakfast.   Yes Historical Provider, MD  Multiple Vitamin (MULTI VITAMIN DAILY PO) Take 1 tablet by mouth daily.   Yes Historical Provider, MD  rOPINIRole (REQUIP) 1 MG tablet Take 1 mg by mouth at bedtime. 09/29/14  Yes Historical Provider, MD  SPIRIVA HANDIHALER 18 MCG inhalation capsule Place 18 mcg into inhaler and inhale every evening. 11/13/14  Yes Historical Provider, MD  acetaminophen-codeine (TYLENOL #3) 300-30 MG per tablet Take 1 tablet by mouth every 6 (six) hours as needed for moderate pain. 11/28/14   [name], MD  naproxen sodium (ALEVE) 220 MG tablet Take 220 mg by mouth 2 (two) times daily with a meal.    Historical Provider, MD   Physical Exam: Vitals:   03/30/16 0333 03/30/16 0540 03/30/16 0741 03/30/16 1637  BP:  113/83  122/85  Pulse:  70  96  Resp:  16  20  Temp:  97.7 F (36.5 C)  98.2 F (36.8 C)  TempSrc:  Oral  Oral  SpO2: 94% 91% 93% 96%  Weight:      Height:        Wt Readings from Last 3 Encounters:  03/28/16 59.5 kg (131 lb 4 oz)  02/12/16 59.6 kg (131 lb 4.8 oz)  11/28/14 63.5 kg (140 lb)    General:  Appears calm and comfortable Eyes: PERRL, normal lids, irises & conjunctiva ENT: grossly normal hearing, lips & tongue Neck: no LAD, masses or thyromegaly Cardiovascular: RRR, no m/r/g. No LE edema. Respiratory:    Bilateral rhonchi are noted. Normal respiratory effort. Abdomen: soft, nontender Skin: no rash or induration seen on limited exam Musculoskeletal: grossly normal tone BUE/BLE Psychiatric: grossly normal mood and affect Neurologic: grossly non-focal.          Labs on Admission:  Basic Metabolic Panel:  Recent Labs Lab 03/28/16 0939 03/29/16 0420  NA 136 144  K 3.7 3.6  CL 97* 103  CO2 29 34*  GLUCOSE 143* 163*  BUN 8 11  CREATININE 0.37* 0.43*  CALCIUM 8.5* 8.5*   Liver Function Tests: No results for input(s): AST, ALT, ALKPHOS, BILITOT, PROT, ALBUMIN in the last 168 hours. No results for input(s): LIPASE, AMYLASE in the last 168 hours. No results for input(s): AMMONIA in the last 168 hours. CBC:  Recent Labs Lab 03/28/16 0939 03/29/16 0420  WBC 7.8 5.1  NEUTROABS 5.8  --   HGB 14.7 14.3  HCT 42.9 41.5  MCV 89.2 90.4  PLT 163 164   Cardiac Enzymes:  Recent Labs Lab 03/28/16 0939 03/28/16 1413 03/28/16 1945  TROPONINI <0.03 0.03* <0.03    BNP (last 3 results) No results for input(s): BNP in the last 8760 hours.  ProBNP (last 3 results) No results for input(s): PROBNP in the last 8760 hours.  CBG:  Recent Labs Lab 03/29/16 0730 03/30/16 0737  GLUCAP 148* 140*    Radiological Exams on Admission: No results found.  EKG: Independently reviewed.  Assessment/Plan Active Problems:   COPD exacerbation (HCC)   Acute on chronic respiratory failure with hypoxia (HCC)   Community acquired pneumonia   1.  acute on chronic Respiratory failure with hypoxia 2. COPD with exacerbation 3. Community-acquired pneumonia 4. Status post pneumonectomy   patient is currently on azithromycin and Rocephin.  At this time would continue with nebulizer treatments as he will are.  Continue with Solu-Medrol as you are.  SHe needs to follow up in the office after discharge  Code Status:  DNR  Family Communication:  none Disposition Plan:  home  Time spent:  60  minutes    I have personally obtained a history, examined the patient, evaluated laboratory and imaging results, formulated the assessment and plan and placed orders.  The Patient requires high complexity decision making for assessment and support.    Allyne Gee, MD [facility] Pulmonary Critical Care Medicine Sleep Medicine

## 2016-03-30 NOTE — Progress Notes (Signed)
CONCERNING: Antibiotic IV to Oral Route Change Policy  RECOMMENDATION: This patient is receiving azithromycin  by the intravenous route.  Based on criteria approved by the Pharmacy and Therapeutics Committee, the antibiotic(s) is/are being converted to the equivalent oral dose form(s).   DESCRIPTION: These criteria include:  Patient being treated for a respiratory tract infection, urinary tract infection, cellulitis or clostridium difficile associated diarrhea if on metronidazole  The patient is not neutropenic and does not exhibit a GI malabsorption state  The patient is eating (either orally or via tube) and/or has been taking other orally administered medications for a least 24 hours  The patient is improving clinically and has a Tmax < 100.5  If you have questions about this conversion, please contact the Pharmacy Department  '[]'$   [phone] )  [facility] '[x]'$   [phone] )  [facility] '[]'$   [phone] )  [facility] '[]'$   [phone] )  [facility] '[]'$   [phone] )  [facility]    MLS

## 2016-03-30 NOTE — Care Management Note (Addendum)
Case Management Note  Patient Details  Name: Kristie [name] MRN: [id number] Date of Birth: 02/18/1927  Subjective/Objective:     A  Case Manager Consult was ordered again today by Mr Benjie Karvonen.  No change since the Case Manager Assessment which was completed yesterday. Continuous 2L N/C at home. [facility] can provide PT and/or RN home health services if ordered by a physician.             Action/Plan:   Expected Discharge Date:  03/30/16               Expected Discharge Plan:     In-House Referral:     Discharge planning Services     Post Acute Care Choice:    Choice offered to:     DME Arranged:    DME Agency:     HH Arranged:    HH Agency:     Status of Service:     If discussed at [employer], dates discussed:    Additional Comments:  Lyris Hitchman A, RN 03/30/2016, 3:35 PM

## 2016-03-30 NOTE — Progress Notes (Addendum)
Up in chair most of  Day; husband in to visit. Chloroseptic spray used prn for throat irritation with benefit reported. Nonproductive cough with pt educated to save specimen if sputum produced with specimen cup at bedside; MD made aware and will continue to attempt collection. 02 remains on 3l/[place]. Up to BR with SB+ and tolerated well. Plans to change azithromycin to po form. Rocephin IV continued.

## 2016-03-30 NOTE — Progress Notes (Signed)
[place] at [place] NAME: Kristie [name]    MR#:  [id number]  DATE OF BIRTH:  1926/04/09  SUBJECTIVE:    Patient still feels tight this am Could not sleep last night  REVIEW OF SYSTEMS:    Review of Systems  Constitutional: Negative.  Negative for chills, fever and malaise/fatigue.  HENT: Negative.  Negative for ear discharge, ear pain, hearing loss, nosebleeds and sore throat.   Eyes: Negative.  Negative for blurred vision and pain.  Respiratory: Positive for cough, shortness of breath and wheezing. Negative for hemoptysis.   Cardiovascular: Negative.  Negative for chest pain, palpitations and leg swelling.  Gastrointestinal: Negative.  Negative for abdominal pain, blood in stool, diarrhea, nausea and vomiting.  Genitourinary: Negative.  Negative for dysuria.  Musculoskeletal: Negative.  Negative for back pain.  Skin: Negative.   Neurological: Negative for dizziness, tremors, speech change, focal weakness, seizures and headaches.  Endo/Heme/Allergies: Negative.  Does not bruise/bleed easily.  Psychiatric/Behavioral: Negative.  Negative for depression, hallucinations and suicidal ideas.    Tolerating Diet: yes      DRUG ALLERGIES:  No Known Allergies  VITALS:  Blood pressure 113/83, pulse 70, temperature 97.7 F (36.5 C), temperature source Oral, resp. rate 16, height '5\' 4"'$  (1.626 m), weight 59.5 kg (131 lb 4 oz), SpO2 93 %.  PHYSICAL EXAMINATION:   Physical Exam  Constitutional: She is oriented to person, place, and time and well-developed, well-nourished, and in no distress. No distress.  HENT:  Head: Normocephalic.  Eyes: No scleral icterus.  Neck: Normal range of motion. Neck supple. No JVD present. No tracheal deviation present.  Cardiovascular: Normal rate, regular rhythm and normal heart sounds.  Exam reveals no gallop and no friction rub.   No murmur heard. Pulmonary/Chest: Effort normal. No respiratory distress.  She has wheezes. She has no rales. She exhibits no tenderness.  Sounds tight with decreased air movement some wheezing audible  Abdominal: Soft. Bowel sounds are normal. She exhibits no distension and no mass. There is no tenderness. There is no rebound and no guarding.  Musculoskeletal: Normal range of motion. She exhibits no edema.  Neurological: She is alert and oriented to person, place, and time.  Skin: Skin is warm. No rash noted. No erythema.  Psychiatric: Affect and judgment normal.      LABORATORY PANEL:   CBC  Recent Labs Lab 03/29/16 0420  WBC 5.1  HGB 14.3  HCT 41.5  PLT 164   ------------------------------------------------------------------------------------------------------------------  Chemistries   Recent Labs Lab 03/29/16 0420  NA 144  K 3.6  CL 103  CO2 34*  GLUCOSE 163*  BUN 11  CREATININE 0.43*  CALCIUM 8.5*   ------------------------------------------------------------------------------------------------------------------  Cardiac Enzymes  Recent Labs Lab 03/28/16 0939 03/28/16 1413 03/28/16 1945  TROPONINI <0.03 0.03* <0.03   ------------------------------------------------------------------------------------------------------------------  RADIOLOGY:  No results found.   ASSESSMENT AND PLAN:   83 year-old Female who presents with shortness of breath and found to have community-acquired pneumonia and COPD exacerbation.   1. Acute on chronic toxic respiratory failure on baseline 2 L of oxygen: Patient being weaned to baseline 2 L of oxygen  Treatment for pneumonia and COPD as outlined below  2. Acute COPD exacerbation: Increase steroids to 40 IV every 12. Continue inhalers and DuoNeb's  3. Community acquired pneumonia: Continue Rocephin and azithromycin. Follow-up on final blood cultures   4. Restless leg syndrome on Requip  5. Cataracts continue eyedrops  Management plans discussed with the patient  and husbandand she  is in agreement.  CODE STATUS: DNR  TOTAL TIME TAKING CARE OF THIS PATIENT: 26 minutes.   PT consult for disposition does not recommend HHC  POSSIBLE D/C 1-2 days, DEPENDING ON CLINICAL CONDITION.   [name] M.D on 03/30/2016 at 11:30 AM  Between 7am to 6pm - Pager - [phone] After 6pm go to [email or web address] - password [email or web address] [facility]  Office  [phone]  CC: Primary care physician; FITZGERALD, DAVID [name], MD  Note: This dictation was prepared with Dragon dictation along with smaller phrase technology. Any transcriptional errors that result from this process are unintentional.

## 2016-03-31 LAB — GLUCOSE, CAPILLARY: Glucose-Capillary: 137 mg/dL — ABNORMAL HIGH (ref 65–99)

## 2016-03-31 LAB — PROCALCITONIN

## 2016-03-31 MED ORDER — METHYLPREDNISOLONE SODIUM SUCC 40 MG IJ SOLR
40.0000 mg | Freq: Every day | INTRAMUSCULAR | Status: DC
  Administered 2016-04-01: 09:00:00 40 mg via INTRAVENOUS
  Filled 2016-03-31: qty 1

## 2016-03-31 NOTE — Progress Notes (Signed)
Pt reports she is feeling much better today with less SOB. Cough primarily nonproductive with only small amount clear mucus. Azithromycin given po. IV Rocephin continued. Up in chair most of day; up to BR and tolerated well. Husband in. Solumedrol taper starting.

## 2016-03-31 NOTE — Progress Notes (Signed)
[place] at [place] NAME: Kristie [name]    MR#:  [id number]  DATE OF BIRTH:  December 18, 1926  SUBJECTIVE:    Patient feeling much better not as tight as yesterday Walking around room and SOB seems to have improved  REVIEW OF SYSTEMS:    Review of Systems  Constitutional: Negative.  Negative for chills, fever and malaise/fatigue.  HENT: Negative.  Negative for ear discharge, ear pain, hearing loss, nosebleeds and sore throat.   Eyes: Negative.  Negative for blurred vision and pain.  Respiratory: Positive for cough, shortness of breath (improved) and wheezing. Negative for hemoptysis.   Cardiovascular: Negative.  Negative for chest pain, palpitations and leg swelling.  Gastrointestinal: Negative.  Negative for abdominal pain, blood in stool, diarrhea, nausea and vomiting.  Genitourinary: Negative.  Negative for dysuria.  Musculoskeletal: Negative.  Negative for back pain.  Skin: Negative.   Neurological: Negative for dizziness, tremors, speech change, focal weakness, seizures and headaches.  Endo/Heme/Allergies: Negative.  Does not bruise/bleed easily.  Psychiatric/Behavioral: Negative.  Negative for depression, hallucinations and suicidal ideas.    Tolerating Diet: yes      DRUG ALLERGIES:  No Known Allergies  VITALS:  Blood pressure 92/63, pulse 83, temperature 97.9 F (36.6 C), temperature source Axillary, resp. rate 18, height '5\' 4"'$  (1.626 m), weight 59.5 kg (131 lb 4 oz), SpO2 92 %.  PHYSICAL EXAMINATION:   Physical Exam  Constitutional: She is oriented to person, place, and time and well-developed, well-nourished, and in no distress. No distress.  HENT:  Head: Normocephalic.  Eyes: No scleral icterus.  Neck: Normal range of motion. Neck supple. No JVD present. No tracheal deviation present.  Cardiovascular: Normal rate, regular rhythm and normal heart sounds.  Exam reveals no gallop and no friction rub.   No murmur  heard. Pulmonary/Chest: Effort normal. No respiratory distress. She has wheezes. She has no rales. She exhibits no tenderness.  Better air flow this am with wheezing b/l  Abdominal: Soft. Bowel sounds are normal. She exhibits no distension and no mass. There is no tenderness. There is no rebound and no guarding.  Musculoskeletal: Normal range of motion. She exhibits no edema.  Neurological: She is alert and oriented to person, place, and time.  Skin: Skin is warm. No rash noted. No erythema.  Psychiatric: Affect and judgment normal.      LABORATORY PANEL:   CBC  Recent Labs Lab 03/29/16 0420  WBC 5.1  HGB 14.3  HCT 41.5  PLT 164   ------------------------------------------------------------------------------------------------------------------  Chemistries   Recent Labs Lab 03/29/16 0420  NA 144  K 3.6  CL 103  CO2 34*  GLUCOSE 163*  BUN 11  CREATININE 0.43*  CALCIUM 8.5*   ------------------------------------------------------------------------------------------------------------------  Cardiac Enzymes  Recent Labs Lab 03/28/16 0939 03/28/16 1413 03/28/16 1945  TROPONINI <0.03 0.03* <0.03   ------------------------------------------------------------------------------------------------------------------  RADIOLOGY:  No results found.   ASSESSMENT AND PLAN:   83 year-old Female who presents with shortness of breath and found to have community-acquired pneumonia and COPD exacerbation.   1. Acute on chronic toxic respiratory failure on baseline 2 L of oxygen: Patient  weaned to baseline 2 L of oxygen  Treatment for pneumonia and COPD as outlined below  2. Acute COPD exacerbation: Decrease steroids to 40 IV daily today and hopefully she will continue to improve Continue inhalers and DuoNeb's  3. Community acquired pneumonia: Continue Rocephin and azithromycin D 4/6 Follow-up on final blood cultures so far NTD  4. Restless leg syndrome on  Requip  5. Cataracts continue eyedrops  Management plans discussed with the patient and she is in agreement.  CODE STATUS: DNR  TOTAL TIME TAKING CARE OF THIS PATIENT: 23 minutes.   PT consult for disposition does not recommend HHC  POSSIBLE D/C 1-2 days, DEPENDING ON CLINICAL CONDITION.   Cheryal Salas M.D on 03/31/2016 at 9:20 AM  Between 7am to 6pm - Pager - [phone] After 6pm go to [email or web address] - password [email or web address] [facility]  Office  [phone]  CC: Primary care physician; FITZGERALD, DAVID [name], MD  Note: This dictation was prepared with Dragon dictation along with smaller phrase technology. Any transcriptional errors that result from this process are unintentional.

## 2016-04-01 LAB — CBC
HEMATOCRIT: 44.6 % (ref 35.0–47.0)
HEMOGLOBIN: 15.2 g/dL (ref 12.0–16.0)
MCH: 30.6 pg (ref 26.0–34.0)
MCHC: 34.1 g/dL (ref 32.0–36.0)
MCV: 89.6 fL (ref 80.0–100.0)
Platelets: 206 10*3/uL (ref 150–440)
RBC: 4.98 MIL/uL (ref 3.80–5.20)
RDW: 13.9 % (ref 11.5–14.5)
WBC: 11.2 10*3/uL — AB (ref 3.6–11.0)

## 2016-04-01 LAB — BASIC METABOLIC PANEL
ANION GAP: 10 (ref 5–15)
BUN: 20 mg/dL (ref 6–20)
CALCIUM: 8.9 mg/dL (ref 8.9–10.3)
CO2: 38 mmol/L — AB (ref 22–32)
Chloride: 95 mmol/L — ABNORMAL LOW (ref 101–111)
Creatinine, Ser: 0.44 mg/dL (ref 0.44–1.00)
GFR calc non Af Amer: >60 mL/min (ref >60)
GLUCOSE: 121 mg/dL — AB (ref 65–99)
POTASSIUM: 3.4 mmol/L — AB (ref 3.5–5.1)
Sodium: 143 mmol/L (ref 135–145)

## 2016-04-01 LAB — GLUCOSE, CAPILLARY: Glucose-Capillary: 111 mg/dL — ABNORMAL HIGH (ref 65–99)

## 2016-04-01 MED ORDER — LEVOFLOXACIN 500 MG PO TABS: 500.0000 mg | ORAL_TABLET | Freq: Every day | ORAL | 0 refills | Status: AC

## 2016-04-01 MED ORDER — PREDNISONE 10 MG PO TABS: ORAL_TABLET | ORAL | 0 refills | Status: DC

## 2016-04-01 NOTE — Care Management (Signed)
Physical therapy evaluation completed. Recommending [facility] as [employer] Patient services. Spoke with Ms. Hauk at the bedside, States that Angela Nevin from Pulmonary Rehabilitation at this facility has already called her to arrange services. States she would call Angela Nevin back at a later date. Ms. Sheriff has 2 liters oxygen chronic in the home. States that her husband will transport. Discharge to home today per Dr. Heron Sabins Shelbie Ammons RN MSN CCM Care Management

## 2016-04-01 NOTE — Progress Notes (Signed)
Discharge paperwork reviewed with patient who verbalized understanding. New paper prescriptions given to patient who verbalized understanding of how and when to take including education about them. Patient's husband at bedside to transport home. Patient's home oxygen at bedside for transport home.

## 2016-04-01 NOTE — Discharge Summary (Signed)
[facility] [place] NAME: Kristie [name]    MR#:  [id number]  DATE OF BIRTH:  [birth year]  DATE OF ADMISSION:  03/28/2016 ADMITTING PHYSICIAN: Theodoro Grist, MD  DATE OF DISCHARGE: 04/01/2016 11:44 AM  PRIMARY CARE PHYSICIAN: Leonel Ramsay, MD    ADMISSION DIAGNOSIS:  Acute respiratory failure with hypoxia ([place]) [J96.01] Community acquired pneumonia, unspecified laterality [J18.9]  DISCHARGE DIAGNOSIS:  Active Problems:   COPD exacerbation ([place])   Acute on chronic respiratory failure with hypoxia ([place])   Community acquired pneumonia   SECONDARY DIAGNOSIS:   Past Medical History:  Diagnosis Date  . COPD (chronic obstructive pulmonary disease) ([place])   . Glaucoma   . Restless legs     HOSPITAL COURSE:   83 year old female with past medical history of COPD, chronic respiratory failure, restless leg, glaucoma who presented to the hospital due to shortness of breath and noted to be in acute on chronic respiratory failure with hypoxia.  1. Acute on chronic respiratory failure with hypoxia-secondary to COPD exacerbation from pneumonia. -Patient was treated with IV steroids, scheduled DuoNeb's, Pulmicort nebs. She was also intermittently placed on BiPAP but now has been weaned off of it. She has improved now and is being discharged on oral prednisone taper and oral Levaquin. She is already on chronic oxygen at home at 2.5 L which we will continue. - pt. Is being referred for outpatient Pulmonary rehab ([place])  2. COPD exacerbation-secondary to community acquired pneumonia. Patient was treated with IV steroids, scheduled DuoNeb's, Pulmicort nebs and has clinically improved.  -now being discharged home prednisone taper and oral Levaquin.  3. Hx of restless leg syndrome-patient will continue her Requip.  4. Glaucoma-patient will continue her latanoprost eyedrops.  DISCHARGE CONDITIONS:   Stable.   CONSULTS OBTAINED:  Treatment  Team:  Allyne Gee, MD  DRUG ALLERGIES:  No Known Allergies  DISCHARGE MEDICATIONS:   Allergies as of 04/01/2016   No Known Allergies     Medication List    TAKE these medications   acetaminophen-codeine 300-30 MG tablet Commonly known as:  TYLENOL #3 Take 1 tablet by mouth every 6 (six) hours as needed for moderate pain.   ALEVE 220 MG tablet Generic drug:  naproxen sodium Take 220 mg by mouth 2 (two) times daily with a meal.   BREO ELLIPTA 100-25 MCG/INH Aepb Generic drug:  fluticasone furoate-vilanterol Inhale 1 puff into the lungs every morning.   chlorpheniramine 4 MG tablet Commonly known as:  CHLOR-TRIMETON Take 4 mg by mouth every evening.   gabapentin 300 MG capsule Commonly known as:  NEURONTIN Take 300 mg by mouth every evening.   latanoprost 0.005 % ophthalmic solution Commonly known as:  XALATAN Place 1 drop into both eyes every evening.   levofloxacin 500 MG tablet Commonly known as:  LEVAQUIN Take 1 tablet (500 mg total) by mouth daily.   Magnesium 250 MG Tabs Take 250 mg by mouth daily with breakfast.   MULTI VITAMIN DAILY PO Take 1 tablet by mouth daily.   predniSONE 10 MG tablet Commonly known as:  DELTASONE Label  & dispense according to the schedule below. 5 Pills PO for 1 day then, 4 Pills PO for 1 day, 3 Pills PO for 1 day, 2 Pills PO for 1 day, 1 Pill PO for 1 days then STOP.   rOPINIRole 1 MG tablet Commonly known as:  REQUIP Take 1 mg by mouth at bedtime.   SPIRIVA HANDIHALER 18  MCG inhalation capsule Generic drug:  tiotropium Place 18 mcg into inhaler and inhale every evening.         DISCHARGE INSTRUCTIONS:   DIET:  Regular diet  DISCHARGE CONDITION:  Stable  ACTIVITY:  Activity as tolerated  OXYGEN:  Home Oxygen: Yes.     Oxygen Delivery: 2.5 liters/min via Patient connected to nasal cannula oxygen  DISCHARGE LOCATION:  home   If you experience worsening of your admission symptoms, develop shortness of  breath, life threatening emergency, suicidal or homicidal thoughts you must seek medical attention immediately by calling 911 or calling your MD immediately  if symptoms less severe.  You Must read complete instructions/literature along with all the possible adverse reactions/side effects for all the Medicines you take and that have been prescribed to you. Take any new Medicines after you have completely understood and accpet all the possible adverse reactions/side effects.   Please note  You were cared for by a hospitalist during your hospital stay. If you have any questions about your discharge medications or the care you received while you were in the hospital after you are discharged, you can call the unit and asked to speak with the hospitalist on call if the hospitalist that took care of you is not available. Once you are discharged, your primary care physician will handle any further medical issues. Please note that NO REFILLS for any discharge medications will be authorized once you are discharged, as it is imperative that you return to your primary care physician (or establish a relationship with a primary care physician if you do not have one) for your aftercare needs so that they can reassess your need for medications and monitor your lab values.     Today   Shortness of breath, wheezing much improved.  No other acute events overnight.    VITAL SIGNS:  Blood pressure 115/79, pulse 83, temperature 97.7 F (36.5 C), temperature source Oral, resp. rate 18, height '5\' 4"'$  (1.626 m), weight 59.5 kg (131 lb 4 oz), SpO2 93 %.  I/O:   Intake/Output Summary (Last 24 hours) at 04/01/16 1751 Last data filed at 04/01/16 1020  Gross per 24 hour  Intake              480 ml  Output              500 ml  Net              -20 ml    PHYSICAL EXAMINATION:  GENERAL:  83 y.o.-year-old patient sitting up in the bed in no acute distress.  EYES: Pupils equal, round, reactive to light and  accommodation. No scleral icterus. Extraocular muscles intact.  HEENT: Head atraumatic, normocephalic. Oropharynx and nasopharynx clear.  NECK:  Supple, no jugular venous distention. No thyroid enlargement, no tenderness.  LUNGS: Prolonged inspiratory and expiratory phase, no wheezing, rales,rhonchi. No use of accessory muscles of respiration.  CARDIOVASCULAR: S1, S2 normal. No murmurs, rubs, or gallops.  ABDOMEN: Soft, non-tender, non-distended. Bowel sounds present. No organomegaly or mass.  EXTREMITIES: No pedal edema, cyanosis, or clubbing.  NEUROLOGIC: Cranial nerves II through XII are intact. No focal motor or sensory defecits b/l.  PSYCHIATRIC: The patient is alert and oriented x 3. Good affect.  SKIN: No obvious rash, lesion, or ulcer.   DATA REVIEW:   CBC  Recent Labs Lab 04/01/16 0417  WBC 11.2*  HGB 15.2  HCT 44.6  PLT 206    Chemistries   Recent Labs Lab  04/01/16 0417  NA 143  K 3.4*  CL 95*  CO2 38*  GLUCOSE 121*  BUN 20  CREATININE 0.44  CALCIUM 8.9    Cardiac Enzymes  Recent Labs Lab 03/28/16 1945  TROPONINI <0.03    Microbiology Results  Results for orders placed or performed during the hospital encounter of 03/28/16  Rapid Influenza A&B Antigens ([place] only)     Status: None   Collection Time: 03/28/16  9:39 AM  Result Value Ref Range Status   Influenza A ([place]) NEGATIVE NEGATIVE Final   Influenza B ([facility]) NEGATIVE NEGATIVE Final  Culture, sputum-assessment     Status: None   Collection Time: 03/30/16 12:37 AM  Result Value Ref Range Status   Specimen Description EXPECTORATED SPUTUM  Final   Special Requests NONE  Final   Sputum evaluation   Final    Sputum specimen not acceptable for testing.  Please recollect.   CALLED KASEY VAUGHANWARD AT 0230 ON 03/30/16 [facility]   Report Status 03/30/2016 FINAL  Final    RADIOLOGY:  No results found.    Management plans discussed with the patient, family and they are in agreement.  CODE STATUS:   Code Status History    Date Active Date Inactive Code Status Order ID Comments User Context   03/28/2016 11:44 AM 04/01/2016  2:49 PM DNR [id number]  Theodoro Grist, MD ED   02/12/2016  2:27 PM 02/15/2016  1:29 PM DNR [id number]  Vaughan Basta, MD Inpatient    Questions for Most Recent Historical Code Status (Order [id number])    Question Answer Comment   In the event of cardiac or respiratory ARREST Do not call a "code blue"    In the event of cardiac or respiratory ARREST Do not perform Intubation, CPR, defibrillation or ACLS    In the event of cardiac or respiratory ARREST Use medication by any route, position, wound care, and other measures to relive pain and suffering. May use oxygen, suction and manual treatment of airway obstruction as needed for comfort.         Advance Directive Documentation   Flowsheet Row Most Recent Value  Type of Advance Directive  Healthcare Power of Attorney [Richard Mcartor/husband/med POA]  Pre-existing out of facility DNR order (yellow form or pink MOST form)  Physician notified to receive inpatient order  "MOST" Form in Place?  No data      TOTAL TIME TAKING CARE OF THIS PATIENT: 40 minutes.    Henreitta Leber M.D on 04/01/2016 at 5:51 PM  Between 7am to 6pm - Pager - [phone]  After 6pm go to [email or web address] - [employer] [facility]  Office  [phone]  CC: Primary care physician; Leonel Ramsay, MD

## 2016-04-01 NOTE — Care Management Important Message (Signed)
Important Message  Patient Details  Name: Kristie [name] MRN: [id number] Date of Birth: Jan 24, 1927   Medicare Important Message Given:  Yes    Shelbie Ammons, RN 04/01/2016, 9:45 AM

## 2016-05-10 DIAGNOSIS — J449 Chronic obstructive pulmonary disease, unspecified: Secondary | ICD-10-CM | POA: Diagnosis not present

## 2016-05-10 DIAGNOSIS — R609 Edema, unspecified: Secondary | ICD-10-CM | POA: Diagnosis not present

## 2016-05-10 DIAGNOSIS — R0602 Shortness of breath: Secondary | ICD-10-CM | POA: Diagnosis not present

## 2016-06-27 DIAGNOSIS — H401131 Primary open-angle glaucoma, bilateral, mild stage: Secondary | ICD-10-CM | POA: Diagnosis not present

## 2016-06-28 DIAGNOSIS — M810 Age-related osteoporosis without current pathological fracture: Secondary | ICD-10-CM | POA: Diagnosis not present

## 2016-06-28 DIAGNOSIS — J432 Centrilobular emphysema: Secondary | ICD-10-CM | POA: Diagnosis not present

## 2016-06-28 DIAGNOSIS — J9611 Chronic respiratory failure with hypoxia: Secondary | ICD-10-CM | POA: Diagnosis not present

## 2016-06-28 DIAGNOSIS — G2581 Restless legs syndrome: Secondary | ICD-10-CM | POA: Diagnosis not present

## 2016-07-05 DIAGNOSIS — J9611 Chronic respiratory failure with hypoxia: Secondary | ICD-10-CM | POA: Diagnosis not present

## 2016-07-05 DIAGNOSIS — J432 Centrilobular emphysema: Secondary | ICD-10-CM | POA: Diagnosis not present

## 2016-07-05 DIAGNOSIS — Z0000 Encounter for general adult medical examination without abnormal findings: Secondary | ICD-10-CM | POA: Diagnosis not present

## 2016-07-05 DIAGNOSIS — G2581 Restless legs syndrome: Secondary | ICD-10-CM | POA: Diagnosis not present

## 2016-08-26 DIAGNOSIS — J9611 Chronic respiratory failure with hypoxia: Secondary | ICD-10-CM | POA: Diagnosis not present

## 2016-08-26 DIAGNOSIS — J449 Chronic obstructive pulmonary disease, unspecified: Secondary | ICD-10-CM | POA: Diagnosis not present

## 2016-08-26 DIAGNOSIS — R0602 Shortness of breath: Secondary | ICD-10-CM | POA: Diagnosis not present

## 2016-12-04 DIAGNOSIS — Z23 Encounter for immunization: Secondary | ICD-10-CM | POA: Diagnosis not present

## 2016-12-24 DIAGNOSIS — H401131 Primary open-angle glaucoma, bilateral, mild stage: Secondary | ICD-10-CM | POA: Diagnosis not present

## 2016-12-26 DIAGNOSIS — J441 Chronic obstructive pulmonary disease with (acute) exacerbation: Secondary | ICD-10-CM | POA: Diagnosis not present

## 2016-12-26 DIAGNOSIS — J309 Allergic rhinitis, unspecified: Secondary | ICD-10-CM | POA: Diagnosis not present

## 2016-12-26 DIAGNOSIS — Z9981 Dependence on supplemental oxygen: Secondary | ICD-10-CM | POA: Diagnosis not present

## 2016-12-30 DIAGNOSIS — J9611 Chronic respiratory failure with hypoxia: Secondary | ICD-10-CM | POA: Diagnosis not present

## 2016-12-30 DIAGNOSIS — G2581 Restless legs syndrome: Secondary | ICD-10-CM | POA: Diagnosis not present

## 2016-12-30 DIAGNOSIS — J432 Centrilobular emphysema: Secondary | ICD-10-CM | POA: Diagnosis not present

## 2016-12-30 DIAGNOSIS — Z0000 Encounter for general adult medical examination without abnormal findings: Secondary | ICD-10-CM | POA: Diagnosis not present

## 2017-01-06 DIAGNOSIS — R600 Localized edema: Secondary | ICD-10-CM | POA: Diagnosis not present

## 2017-01-06 DIAGNOSIS — M810 Age-related osteoporosis without current pathological fracture: Secondary | ICD-10-CM | POA: Diagnosis not present

## 2017-01-06 DIAGNOSIS — J432 Centrilobular emphysema: Secondary | ICD-10-CM | POA: Diagnosis not present

## 2017-01-06 DIAGNOSIS — G2581 Restless legs syndrome: Secondary | ICD-10-CM | POA: Diagnosis not present

## 2017-01-06 DIAGNOSIS — J9611 Chronic respiratory failure with hypoxia: Secondary | ICD-10-CM | POA: Diagnosis not present

## 2017-01-24 ENCOUNTER — Emergency Department: Payer: Medicare Other

## 2017-01-24 ENCOUNTER — Other Ambulatory Visit: Payer: Self-pay

## 2017-01-24 ENCOUNTER — Encounter: Payer: Self-pay | Admitting: *Deleted

## 2017-01-24 ENCOUNTER — Inpatient Hospital Stay
Admission: EM | Admit: 2017-01-24 | Discharge: 2017-01-26 | DRG: 871 | Disposition: A | Discharge status: Home / Self Care | Payer: Medicare Other | Attending: Internal Medicine | Admitting: Internal Medicine

## 2017-01-24 DIAGNOSIS — Z902 Acquired absence of lung [part of]: Secondary | ICD-10-CM

## 2017-01-24 DIAGNOSIS — Z79899 Other long term (current) drug therapy: Secondary | ICD-10-CM

## 2017-01-24 DIAGNOSIS — R0600 Dyspnea, unspecified: Secondary | ICD-10-CM | POA: Diagnosis not present

## 2017-01-24 DIAGNOSIS — Z9981 Dependence on supplemental oxygen: Secondary | ICD-10-CM

## 2017-01-24 DIAGNOSIS — R0602 Shortness of breath: Secondary | ICD-10-CM

## 2017-01-24 DIAGNOSIS — A419 Sepsis, unspecified organism: Principal | ICD-10-CM | POA: Diagnosis present

## 2017-01-24 DIAGNOSIS — R9389 Abnormal findings on diagnostic imaging of other specified body structures: Secondary | ICD-10-CM | POA: Diagnosis not present

## 2017-01-24 DIAGNOSIS — H409 Unspecified glaucoma: Secondary | ICD-10-CM | POA: Diagnosis present

## 2017-01-24 DIAGNOSIS — J440 Chronic obstructive pulmonary disease with acute lower respiratory infection: Secondary | ICD-10-CM | POA: Diagnosis present

## 2017-01-24 DIAGNOSIS — J181 Lobar pneumonia, unspecified organism: Secondary | ICD-10-CM | POA: Diagnosis not present

## 2017-01-24 DIAGNOSIS — Z87891 Personal history of nicotine dependence: Secondary | ICD-10-CM

## 2017-01-24 DIAGNOSIS — J439 Emphysema, unspecified: Secondary | ICD-10-CM | POA: Diagnosis not present

## 2017-01-24 DIAGNOSIS — J9611 Chronic respiratory failure with hypoxia: Secondary | ICD-10-CM | POA: Diagnosis present

## 2017-01-24 DIAGNOSIS — Z791 Long term (current) use of non-steroidal anti-inflammatories (NSAID): Secondary | ICD-10-CM | POA: Diagnosis not present

## 2017-01-24 DIAGNOSIS — J189 Pneumonia, unspecified organism: Secondary | ICD-10-CM | POA: Diagnosis not present

## 2017-01-24 DIAGNOSIS — R0902 Hypoxemia: Secondary | ICD-10-CM | POA: Diagnosis not present

## 2017-01-24 DIAGNOSIS — Z8052 Family history of malignant neoplasm of bladder: Secondary | ICD-10-CM | POA: Diagnosis not present

## 2017-01-24 DIAGNOSIS — G2581 Restless legs syndrome: Secondary | ICD-10-CM | POA: Diagnosis present

## 2017-01-24 DIAGNOSIS — J9621 Acute and chronic respiratory failure with hypoxia: Secondary | ICD-10-CM | POA: Diagnosis not present

## 2017-01-24 DIAGNOSIS — Z66 Do not resuscitate: Secondary | ICD-10-CM | POA: Diagnosis present

## 2017-01-24 DIAGNOSIS — I248 Other forms of acute ischemic heart disease: Secondary | ICD-10-CM | POA: Diagnosis present

## 2017-01-24 DIAGNOSIS — R918 Other nonspecific abnormal finding of lung field: Secondary | ICD-10-CM | POA: Diagnosis not present

## 2017-01-24 DIAGNOSIS — C3412 Malignant neoplasm of upper lobe, left bronchus or lung: Secondary | ICD-10-CM | POA: Diagnosis present

## 2017-01-24 LAB — BASIC METABOLIC PANEL
Anion gap: 12 (ref 5–15)
BUN: 11 mg/dL (ref 6–20)
CALCIUM: 9.1 mg/dL (ref 8.9–10.3)
CO2: 32 mmol/L (ref 22–32)
CREATININE: 0.56 mg/dL (ref 0.44–1.00)
Chloride: 94 mmol/L — ABNORMAL LOW (ref 101–111)
GFR calc non Af Amer: >60 mL/min (ref >60)
Glucose, Bld: 138 mg/dL — ABNORMAL HIGH (ref 65–99)
Potassium: 3.9 mmol/L (ref 3.5–5.1)
SODIUM: 138 mmol/L (ref 135–145)

## 2017-01-24 LAB — CBC
HCT: 51.2 % — ABNORMAL HIGH (ref 35.0–47.0)
Hemoglobin: 17 g/dL — ABNORMAL HIGH (ref 12.0–16.0)
MCH: 29.5 pg (ref 26.0–34.0)
MCHC: 33.3 g/dL (ref 32.0–36.0)
MCV: 88.7 fL (ref 80.0–100.0)
PLATELETS: 167 10*3/uL (ref 150–440)
RBC: 5.77 MIL/uL — ABNORMAL HIGH (ref 3.80–5.20)
RDW: 13.8 % (ref 11.5–14.5)
WBC: 13.2 10*3/uL — ABNORMAL HIGH (ref 3.6–11.0)

## 2017-01-24 LAB — TROPONIN I
TROPONIN I: 0.06 ng/mL — AB (ref <0.03)
Troponin I: 0.05 ng/mL (ref <0.03)
Troponin I: 0.04 ng/mL (ref <0.03)

## 2017-01-24 MED ORDER — ONDANSETRON HCL 4 MG/2ML IJ SOLN: 4.0000 mg | Freq: Four times a day (QID) | INTRAMUSCULAR | Status: DC | PRN

## 2017-01-24 MED ORDER — NAPROXEN 250 MG PO TABS
250.0000 mg | ORAL_TABLET | Freq: Two times a day (BID) | ORAL | Status: DC
  Administered 2017-01-24 – 2017-01-26 (×4): 250 mg via ORAL
  Filled 2017-01-24 (×4): qty 1

## 2017-01-24 MED ORDER — GABAPENTIN 300 MG PO CAPS
300.0000 mg | ORAL_CAPSULE | Freq: Every evening | ORAL | Status: DC
  Administered 2017-01-24 – 2017-01-25 (×2): 300 mg via ORAL
  Filled 2017-01-24 (×3): qty 1

## 2017-01-24 MED ORDER — LEVOFLOXACIN IN D5W 750 MG/150ML IV SOLN
750.0000 mg | Freq: Once | INTRAVENOUS | Status: AC
  Administered 2017-01-24: 750 mg via INTRAVENOUS
  Filled 2017-01-24: qty 150

## 2017-01-24 MED ORDER — DOCUSATE SODIUM 100 MG PO CAPS: 100.0000 mg | ORAL_CAPSULE | Freq: Two times a day (BID) | ORAL | Status: DC | PRN

## 2017-01-24 MED ORDER — LEVOFLOXACIN 750 MG PO TABS
750.0000 mg | ORAL_TABLET | ORAL | Status: DC
  Administered 2017-01-26: 750 mg via ORAL
  Filled 2017-01-24: qty 1

## 2017-01-24 MED ORDER — TIOTROPIUM BROMIDE MONOHYDRATE 18 MCG IN CAPS
18.0000 ug | ORAL_CAPSULE | Freq: Every evening | RESPIRATORY_TRACT | Status: DC
  Administered 2017-01-25 – 2017-01-26 (×2): 18 ug via RESPIRATORY_TRACT
  Filled 2017-01-24: qty 5

## 2017-01-24 MED ORDER — ROPINIROLE HCL 1 MG PO TABS
1.0000 mg | ORAL_TABLET | Freq: Every day | ORAL | Status: DC
  Administered 2017-01-24 – 2017-01-25 (×2): 1 mg via ORAL
  Filled 2017-01-24 (×2): qty 1

## 2017-01-24 MED ORDER — LEVOFLOXACIN 250 MG PO TABS
250.0000 mg | ORAL_TABLET | Freq: Once | ORAL | Status: AC
  Administered 2017-01-24: 18:00:00 250 mg via ORAL
  Filled 2017-01-24: qty 1

## 2017-01-24 MED ORDER — IPRATROPIUM-ALBUTEROL 0.5-2.5 (3) MG/3ML IN SOLN
3.0000 mL | Freq: Once | RESPIRATORY_TRACT | Status: AC
  Administered 2017-01-24: 3 mL via RESPIRATORY_TRACT
  Filled 2017-01-24: qty 3

## 2017-01-24 MED ORDER — LEVOFLOXACIN IN D5W 750 MG/150ML IV SOLN: 750.0000 mg | INTRAVENOUS | Status: DC

## 2017-01-24 MED ORDER — DIPHENHYDRAMINE HCL 25 MG PO CAPS
25.0000 mg | ORAL_CAPSULE | Freq: Four times a day (QID) | ORAL | Status: DC
  Administered 2017-01-24 – 2017-01-26 (×5): 25 mg via ORAL
  Filled 2017-01-24 (×5): qty 1

## 2017-01-24 MED ORDER — LATANOPROST 0.005 % OP SOLN
1.0000 [drp] | Freq: Every evening | OPHTHALMIC | Status: DC
  Administered 2017-01-24 – 2017-01-25 (×2): 1 [drp] via OPHTHALMIC
  Filled 2017-01-24: qty 2.5

## 2017-01-24 MED ORDER — METHYLPREDNISOLONE SODIUM SUCC 125 MG IJ SOLR
125.0000 mg | Freq: Once | INTRAMUSCULAR | Status: AC
  Administered 2017-01-24: 125 mg via INTRAVENOUS
  Filled 2017-01-24: qty 2

## 2017-01-24 MED ORDER — ENOXAPARIN SODIUM 40 MG/0.4ML ~~LOC~~ SOLN
40.0000 mg | SUBCUTANEOUS | Status: DC
  Administered 2017-01-24 – 2017-01-25 (×2): 40 mg via SUBCUTANEOUS
  Filled 2017-01-24 (×2): qty 0.4

## 2017-01-24 NOTE — ED Notes (Signed)
Patient's Levaquin stopped due to redness tracing her veins.  EDP made aware and verbal orders to d/c levaquin at this time.  Patient denies any pain, itching, or complications at this time and states "Ive taking levaquin in pill form before with no problems". Patient in NAD.

## 2017-01-24 NOTE — H&P (Signed)
[facility] [place] NAME: Kristie [name]    MR#:  [id number]  DATE OF BIRTH:  05/12/1926  DATE OF ADMISSION:  01/24/2017  PRIMARY CARE PHYSICIAN: Leonel Ramsay, MD   REQUESTING/REFERRING PHYSICIAN  CHIEF COMPLAINT:   Sob  HISTORY OF PRESENT ILLNESS:  Kristie [name]  is a 83 y.o. female with a known history of chronic respiratory failure lives on 2 L of oxygen, COPD, history of lung cancer status post right pneumonectomy in 1979, not followed by any oncologist, has been using 4 L of oxygen for the past few days is presenting to the ED with a chief complaint of worsening of shortness of breath. CT of the chest has revealed failure solid left upper lobe pulmonary nodule suspicious for slowly growing primary bronchogenic carcinoma.  Also new patchy  bronchovascular consolidation noticed in the left upper lobe.  Hospitalist team is called to admit the patient after getting blood cultures and started on IV antibiotics  PAST MEDICAL HISTORY:   Past Medical History:  Diagnosis Date  . COPD (chronic obstructive pulmonary disease) ([place])   . Glaucoma   . Restless legs     PAST SURGICAL HISTOIRY:   Past Surgical History:  Procedure Laterality Date  . APPENDECTOMY    . LUNG REMOVAL, PARTIAL Right    squamous cell carcinoma, all lobes on the right were removed.  . TONSILLECTOMY      SOCIAL HISTORY:   Social History   Tobacco Use  . Smoking status: Former Research scientist (life sciences)  . Smokeless tobacco: Never Used  Substance Use Topics  . Alcohol use: Yes    Comment: occasionally    FAMILY HISTORY:   Family History  Problem Relation Age of Onset  . Bladder Cancer Mother   . CAD Father     DRUG ALLERGIES:  No Known Allergies  REVIEW OF SYSTEMS:  CONSTITUTIONAL: No fever, fatigue or weakness.  EYES: No blurred or double vision.  EARS, NOSE, AND THROAT: No tinnitus or ear pain.  RESPIRATORY: has  cough, shortness of breath, no   wheezing or hemoptysis.  CARDIOVASCULAR: No chest pain, orthopnea, edema.  GASTROINTESTINAL: No nausea, vomiting, diarrhea or abdominal pain.  GENITOURINARY: No dysuria, hematuria.  ENDOCRINE: No polyuria, nocturia,  HEMATOLOGY: No anemia, easy bruising or bleeding SKIN: No rash or lesion. MUSCULOSKELETAL: No joint pain or arthritis.   NEUROLOGIC: No tingling, numbness, weakness.  PSYCHIATRY: No anxiety or depression.   MEDICATIONS AT HOME:   Prior to Admission medications   Medication Sig Start Date End Date Taking? Authorizing Provider  gabapentin (NEURONTIN) 300 MG capsule Take 300 mg by mouth every evening. 11/05/14  Yes [provider]  latanoprost (XALATAN) 0.005 % ophthalmic solution Place 1 drop into both eyes every evening. 10/01/14  Yes [provider]  rOPINIRole (REQUIP) 1 MG tablet Take 1 mg by mouth at bedtime. 09/29/14  Yes [provider]  SPIRIVA HANDIHALER 18 MCG inhalation capsule Place 18 mcg into inhaler and inhale every evening. 11/13/14  Yes [provider]  acetaminophen-codeine (TYLENOL #3) 300-30 MG per tablet Take 1 tablet by mouth every 6 (six) hours as needed for moderate pain. Patient not taking: Reported on 01/24/2017 11/28/14   [name], MD  chlorpheniramine (CHLOR-TRIMETON) 4 MG tablet Take 4 mg by mouth every evening.    [provider]  naproxen sodium (ALEVE) 220 MG tablet Take 220 mg by mouth 2 (two) times daily with a meal.  [provider]  predniSONE (DELTASONE) 10 MG tablet Label  & dispense according to the schedule below. 5 Pills PO for 1 day then, 4 Pills PO for 1 day, 3 Pills PO for 1 day, 2 Pills PO for 1 day, 1 Pill PO for 1 days then STOP. Patient not taking: Reported on 01/24/2017 04/01/16   Henreitta Leber, MD      VITAL SIGNS:  Blood pressure (!) 149/67, pulse (!) 104, temperature 98.1 F (36.7 C), temperature source Oral, resp. rate (!) 31, height 5\' 4"  (1.626 m), weight  59 kg (130 lb), SpO2 (!) 89 %.  PHYSICAL EXAMINATION:  GENERAL:  83 y.o.-year-old patient lying in the bed with no acute distress.  EYES: Pupils equal, round, reactive to light and accommodation. No scleral icterus. Extraocular muscles intact.  HEENT: Head atraumatic, normocephalic. Oropharynx and nasopharynx clear.  NECK:  Supple, no jugular venous distention. No thyroid enlargement, no tenderness.  LUNGS: Mod breath sounds on left, no wheezing, rales,rhonchi , has repitation. No use of accessory muscles of respiration.  Status post right-sided pneumonectomy CARDIOVASCULAR: S1, S2 normal. No murmurs, rubs, or gallops.  ABDOMEN: Soft, nontender, nondistended. Bowel sounds present. No organomegaly or mass.  EXTREMITIES: No pedal edema, cyanosis, or clubbing.  NEUROLOGIC: Cranial nerves II through XII are intact. Muscle strength 5/5 in all extremities. Sensation intact. Gait not checked.  PSYCHIATRIC: The patient is alert and oriented x 3.  SKIN: No obvious rash, lesion, or ulcer.   LABORATORY PANEL:   CBC Recent Labs  Lab 01/24/17 1037  WBC 13.2*  HGB 17.0*  HCT 51.2*  PLT 167   ------------------------------------------------------------------------------------------------------------------  Chemistries  Recent Labs  Lab 01/24/17 1037  NA 138  K 3.9  CL 94*  CO2 32  GLUCOSE 138*  BUN 11  CREATININE 0.56  CALCIUM 9.1   ------------------------------------------------------------------------------------------------------------------  Cardiac Enzymes Recent Labs  Lab 01/24/17 1037  TROPONINI 0.06*   ------------------------------------------------------------------------------------------------------------------  RADIOLOGY:  Dg Chest 2 View  Result Date: 01/24/2017 CLINICAL DATA:  Shortness of Breath EXAM: CHEST  2 VIEW COMPARISON:  March 28, 2016 FINDINGS: There is opacification of the right hemithorax. There is extensive calcification as well as pleural effusion  and thickening, likely due to fibrosis from chronic empyema on the right. There is postoperative change on the left. There is fibrosis throughout much of the left lung with scarring in the left base. There is a nodular opacity in the left upper lobe measuring 1.8 x 1.0 cm, not present previously. Heart is upper normal in size with mild prominence of the pulmonary vascularity on the left, likely due to blood flow essentially exclusively handle by the left lung. There is aortic atherosclerosis. No adenopathy evident. There are no evident bone lesions. Bones are osteoporotic. IMPRESSION: 1. 1.8 x 1.0 cm nodular opacity left upper lobe, not seen previously. Advise noncontrast enhanced chest CT to further assess. Neoplastic focus in the left upper lobe must be of concern in this circumstance. 2. On the right, there is diffuse opacification and calcification, likely due to chronic fibrosis with questionable associated chronic effusion. 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 3.  Fibrosis throughout the left lung with scarring left base. 4. Stable cardiac silhouette. Prominence of pulmonary vascularity on the left is likely due to redistribution of blood flow to viable lung segments. 5.  Aortic atherosclerosis. 6.  Bones osteoporotic. Aortic Atherosclerosis (ICD10-I70.0). Electronically Signed   By: Lowella Grip III M.D.   On:  01/24/2017 09:58   Ct Chest Wo Contrast  Result Date: 01/24/2017 CLINICAL DATA:  New left upper lung nodule on chest radiograph. Remote history of right pneumonectomy for lung cancer. EXAM: CT CHEST WITHOUT CONTRAST TECHNIQUE: Multidetector CT imaging of the chest was performed following the standard protocol without IV contrast. COMPARISON:  Chest radiograph from earlier today. 05/23/2004 chest CT. FINDINGS: Motion degraded scan. Cardiovascular: Top-normal heart size. Trace pericardial effusion/thickening is stable. Left main, left  anterior descending and right coronary atherosclerosis. Atherosclerotic nonaneurysmal thoracic aorta. Dilated main pulmonary artery (3.5 cm diameter). Mediastinum/Nodes: Hypodense 0.8 cm right thyroid lobe nodule. Mildly patulous thoracic esophagus with small fluid level in the mid to lower thoracic esophagus. No axillary adenopathy. Mildly enlarged 1.1 cm AP window node (series 2/ image 47), stable since 05/23/2004 chest CT. No new pathologically enlarged mediastinal or discrete hilar nodes on this noncontrast scan. Lungs/Pleura: Status post right pneumonectomy. Stable chronic circumferential right pleural thickening and coarse right pleural calcification with stable loculated small right pleural effusion. No left pneumothorax. No left pleural effusion. Moderate centrilobular emphysema in the left lung. Irregular solid 1.4 x 1.0 cm anterior left upper lobe pulmonary nodule (series 3/ image 22), increased from 0.7 x 0.6 cm on 05/23/2004. New patchy peribronchovascular consolidation in the posterior left upper lobe (series 3/ image 45) and throughout the left lower lobe, with associated scattered parenchymal banding. Upper abdomen: Small simple 1.0 cm anterior left liver lobe cyst. Musculoskeletal: No aggressive appearing focal osseous lesions. Moderate thoracic spondylosis. IMPRESSION: 1. Irregular solid 1.4 cm anterior left upper lobe pulmonary nodule, increased in size from 0.7 cm on 10/2004 chest CT, suspicious for a slowly growing primary bronchogenic carcinoma. PET-CT is indicated for further characterization and can be performed as clinically warranted. 2. New patchy peribronchovascular consolidation in the posterior left upper lobe and throughout the left lower lobe with associated scattered parenchymal banding, favor multilobar pneumonia superimposed on chronic postinfectious scarring. Recommend attention on follow-up post treatment chest CT in 3 months. 3. 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 4. No findings suspicious for metastatic disease in the chest. Mild AP window lymphadenopathy is stable since 2006, most compatible with benign reactive adenopathy. 5. Dilated main pulmonary artery, suggesting pulmonary arterial hypertension. 6. Left main and two-vessel coronary atherosclerosis. Aortic Atherosclerosis (ICD10-I70.0) and Emphysema (ICD10-J43.9). Electronically Signed   By: Ilona Sorrel M.D.   On: 01/24/2017 11:14    EKG:   Orders placed or performed during the hospital encounter of 03/28/16  . ED EKG  . ED EKG  . EKG 12-Lead  . EKG 12-Lead  . EKG    IMPRESSION AND PLAN:   Ms. Biondo is an 83 year old female with history of chronic respiratory failure lives on 2 L of oxygen, COPD, history of lung cancer status post right pneumonectomy in 1979, not followed by any oncologist, has been using 4 L of oxygen for the past few days is presenting to the ED with a chief complaint of worsening of shortness of breath.    #Sepsis-meets criteria with leukocytosis and tachycardia  admit to MedSurg floor Sepsis from pneumonia IV levofloxacin. Sputum culture and sensitivity if we can obtain sputum sample Bronchodilator therapy and supportive treatment Consult placed to pulmonology and infectious disease-patient's primary care physician   #Elevated troponin could be demand ischemia from sepsis Patient denies any chest pain.  Monitor on telemetry and cycle troponins  #Cancerous looking lung nodule on the left  Lung Pulmonology consult is placed to Dr. Heidi Dach  outpatient follow-up with oncology as required  #Restless leg syndrome continue Requip  #COPD no exacerbation continue Spiriva and breathing treatments as needed    All the records are reviewed and case discussed with ED provider. Management plans discussed with the patient, family and they are in agreement.  CODE STATUS: DNR/ husband is HCPOA  TOTAL TIME TAKING CARE OF THIS PATIENT: 45  minutes.    Note: This dictation was prepared with Dragon dictation along with smaller phrase technology. Any transcriptional errors that result from this process are unintentional.  Nicholes Mango M.D on 01/24/2017 at 12:53 PM  Between 7am to 6pm - Pager - [phone]  After 6pm go to [email or web address] - password [email or web address] [facility]  [place] [facility]  Office  [phone]  CC: Primary care physician; Leonel Ramsay, MD

## 2017-01-24 NOTE — Progress Notes (Signed)
ED RN, Delilah Shan reported patient reaction to IV Levaquin after 1/4 of infusion. MD and pharmacy made aware. Madlyn Frankel, RN

## 2017-01-24 NOTE — Consult Note (Signed)
[facility] Infectious Disease     Reason for Consult: PNA    Referring Physician:  Nicholes Mango Date of Admission:  01/24/2017   Active Problems:   PNA (pneumonia)   HPI: Kristie [name] is a 83 y.o. female admitted with SOB and cough. She had CT chest and has new LUL nodules and consolidation.   On admission wbc 13, no fevers.  She is s/p R Pneumonectomy in past.  Currently reports feeling a little better. No fevers, chills.   Past Medical History:  Diagnosis Date  . COPD (chronic obstructive pulmonary disease) ([place])   . Glaucoma   . Restless legs    Past Surgical History:  Procedure Laterality Date  . APPENDECTOMY    . LUNG REMOVAL, PARTIAL Right    squamous cell carcinoma, all lobes on the right were removed.  . TONSILLECTOMY     Social History   Tobacco Use  . Smoking status: Former Research scientist (life sciences)  . Smokeless tobacco: Never Used  Substance Use Topics  . Alcohol use: Yes    Comment: occasionally  . Drug use: Not on file   Family History  Problem Relation Age of Onset  . Bladder Cancer Mother   . CAD Father     Allergies: No Known Allergies  Current antibiotics: Antibiotics Given (last 72 hours)    Date/Time Action Medication Dose Rate   01/24/17 1221 New Bag/Given   levofloxacin (LEVAQUIN) IVPB 750 mg 750 mg 100 mL/hr      MEDICATIONS: . diphenhydrAMINE  25 mg Oral Q6H  . enoxaparin (LOVENOX) injection  40 mg Subcutaneous Q24H  . gabapentin  300 mg Oral QPM  . latanoprost  1 drop Both Eyes QPM  . naproxen  250 mg Oral BID WC  . rOPINIRole  1 mg Oral QHS  . tiotropium  18 mcg Inhalation QPM    Review of Systems - 11 systems reviewed and negative per HPI   OBJECTIVE: Temp:  [97.7 F (36.5 C)-98.1 F (36.7 C)] 97.7 F (36.5 C) (11/09 1334) Pulse Rate:  [102-107] 106 (11/09 1334) Resp:  [20-38] 20 (11/09 1334) BP: (109-149)/(60-73) 109/73 (11/09 1334) SpO2:  [89 %-96 %] 89 % (11/09 1334) Weight:  [59 kg (130 lb)-59.8 kg (131 lb 14.4 oz)] 59.8 kg (131  lb 14.4 oz) (11/09 1334) Physical Exam  Constitutional:  Frail oriented to person, place, and time. appears well-developed and well-nourished. No distress.  HENT: [place]/AT, PERRLA, no scleral icterus Mouth/Throat: Oropharynx is clear and moist. No oropharyngeal exudate.  Cardiovascular: Normal rate, regular rhythm and normal heart sounds.  Pulmonary/Chest: Diminished BS on R, Rhonchi on L.  Neck = supple, no nuchal rigidity Abdominal: Soft. Bowel sounds are normal.  exhibits no distension. Lymphadenopathy: no cervical adenopathy. No axillary adenopathy Neurological: alert and oriented to person, place, and time.  Skin: Skin is warm and dry. No rash noted. No erythema.  Psychiatric: a normal mood and affect.  behavior is normal.    LABS: Results for orders placed or performed during the hospital encounter of 01/24/17 (from the past 48 hour(s))  CBC     Status: Abnormal   Collection Time: 01/24/17 10:37 AM  Result Value Ref Range   WBC 13.2 (H) 3.6 - 11.0 K/uL   RBC 5.77 (H) 3.80 - 5.20 MIL/uL   Hemoglobin 17.0 (H) 12.0 - 16.0 g/dL   HCT 51.2 (H) 35.0 - 47.0 %   MCV 88.7 80.0 - 100.0 fL   MCH 29.5 26.0 - 34.0 pg   MCHC  33.3 32.0 - 36.0 g/dL   RDW 13.8 11.5 - 14.5 %   Platelets 167 150 - 440 K/uL  Basic metabolic panel     Status: Abnormal   Collection Time: 01/24/17 10:37 AM  Result Value Ref Range   Sodium 138 135 - 145 mmol/L   Potassium 3.9 3.5 - 5.1 mmol/L   Chloride 94 (L) 101 - 111 mmol/L   CO2 32 22 - 32 mmol/L   Glucose, Bld 138 (H) 65 - 99 mg/dL   BUN 11 6 - 20 mg/dL   Creatinine, Ser 0.56 0.44 - 1.00 mg/dL   Calcium 9.1 8.9 - 10.3 mg/dL   GFR calc non Af Amer >60 >60 mL/min   GFR calc Af Amer >60 >60 mL/min    Comment: (NOTE) The eGFR has been calculated using the CKD EPI equation. This calculation has not been validated in all clinical situations. eGFR's persistently <60 mL/min signify possible Chronic Kidney Disease.    Anion gap 12 5 - 15  Troponin I      Status: Abnormal   Collection Time: 01/24/17 10:37 AM  Result Value Ref Range   Troponin I 0.06 (HH) <0.03 ng/mL    Comment: CRITICAL RESULT CALLED TO, READ BACK BY AND VERIFIED WITH DONALD SWEENEY AT 1139 01/24/17 DAS   Troponin I (q 6hr x 3)     Status: Abnormal   Collection Time: 01/24/17  1:35 PM  Result Value Ref Range   Troponin I 0.05 (HH) <0.03 ng/mL    Comment: CRITICAL VALUE NOTED. VALUE IS CONSISTENT WITH PREVIOUSLY REPORTED/CALLED VALUE DAS   No components found for: ESR, C REACTIVE PROTEIN MICRO: No results found for this or any previous visit (from the past 720 hour(s)).  IMAGING: Dg Chest 2 View  Result Date: 01/24/2017 CLINICAL DATA:  Shortness of Breath EXAM: CHEST  2 VIEW COMPARISON:  March 28, 2016 FINDINGS: There is opacification of the right hemithorax. 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 There is postoperative change on the left. There is fibrosis throughout much of the left lung with scarring in the left base. There is a nodular opacity in the left upper lobe measuring 1.8 x 1.0 cm, not present previously. Heart is upper normal in size with mild prominence of the pulmonary vascularity on the left, likely due to blood flow essentially exclusively handle by the left lung. There is aortic atherosclerosis. No adenopathy evident. There are no evident bone lesions. Bones are osteoporotic. IMPRESSION: 1. 1.8 x 1.0 cm nodular opacity left upper lobe, not seen previously. Advise noncontrast enhanced chest CT to further assess. Neoplastic focus in the left upper lobe must be of concern in this circumstance. 2. On the right, there is diffuse opacification and calcification, likely due to chronic fibrosis with questionable associated chronic effusion. 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 3.  Fibrosis throughout the left lung with  scarring left base. 4. Stable cardiac silhouette. Prominence of pulmonary vascularity on the left is likely due to redistribution of blood flow to viable lung segments. 5.  Aortic atherosclerosis. 6.  Bones osteoporotic. Aortic Atherosclerosis (ICD10-I70.0). Electronically Signed   By: Lowella Grip III M.D.   On: 01/24/2017 09:58   Ct Chest Wo Contrast  Result Date: 01/24/2017 CLINICAL DATA:  New left upper lung nodule on chest radiograph. Remote history of right pneumonectomy for lung cancer. EXAM: CT CHEST WITHOUT  CONTRAST TECHNIQUE: Multidetector CT imaging of the chest was performed following the standard protocol without IV contrast. COMPARISON:  Chest radiograph from earlier today. 05/23/2004 chest CT. FINDINGS: Motion degraded scan. Cardiovascular: Top-normal heart size. Trace pericardial effusion/thickening is stable. Left main, left anterior descending and right coronary atherosclerosis. Atherosclerotic nonaneurysmal thoracic aorta. Dilated main pulmonary artery (3.5 cm diameter). Mediastinum/Nodes: Hypodense 0.8 cm right thyroid lobe nodule. Mildly patulous thoracic esophagus with small fluid level in the mid to lower thoracic esophagus. No axillary adenopathy. Mildly enlarged 1.1 cm AP window node (series 2/ image 47), stable since 05/23/2004 chest CT. No new pathologically enlarged mediastinal or discrete hilar nodes on this noncontrast scan. Lungs/Pleura: Status post right pneumonectomy. Stable chronic circumferential right pleural thickening and coarse right pleural calcification with stable loculated small right pleural effusion. No left pneumothorax. No left pleural effusion. Moderate centrilobular emphysema in the left lung. Irregular solid 1.4 x 1.0 cm anterior left upper lobe pulmonary nodule (series 3/ image 22), increased from 0.7 x 0.6 cm on 05/23/2004. New patchy peribronchovascular consolidation in the posterior left upper lobe (series 3/ image 45) and throughout the left lower lobe,  with associated scattered parenchymal banding. Upper abdomen: Small simple 1.0 cm anterior left liver lobe cyst. Musculoskeletal: No aggressive appearing focal osseous lesions. Moderate thoracic spondylosis. IMPRESSION: 1. Irregular solid 1.4 cm anterior left upper lobe pulmonary nodule, increased in size from 0.7 cm on 10/2004 chest CT, suspicious for a slowly growing primary bronchogenic carcinoma. PET-CT is indicated for further characterization and can be performed as clinically warranted. 2. New patchy peribronchovascular consolidation in the posterior left upper lobe and throughout the left lower lobe with associated scattered parenchymal banding, favor multilobar pneumonia superimposed on chronic postinfectious scarring. Recommend attention on follow-up post treatment chest CT in 3 months. 3. 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 4. No findings suspicious for metastatic disease in the chest. Mild AP window lymphadenopathy is stable since 2006, most compatible with benign reactive adenopathy. 5. Dilated main pulmonary artery, suggesting pulmonary arterial hypertension. 6. Left main and two-vessel coronary atherosclerosis. Aortic Atherosclerosis (ICD10-I70.0) and Emphysema (ICD10-J43.9). Electronically Signed   By: Ilona Sorrel M.D.   On: 01/24/2017 11:14    Assessment:   Kristie [name] is a 83 y.o. female with COPD, s/p R Pneumonectomy in distant past now with increasing SOB and found to have possible L sided nodules and consolidation. She will need pulmonary evaluation for the nodules. Can try to get sputum as well  Recommendations Continue levofloxacin for total of 7 days. Check sputum. Pulmonary opinion on the nodules.  Thank you very much for allowing me to participate in the care of this patient. Please call with questions.   Cheral Marker. Ola Spurr, MD

## 2017-01-24 NOTE — ED Triage Notes (Signed)
Pt arrived via EMS from home with difficulty breathing that started yesterday. Pt states she is normally on 2 liters of oxygen at home but has need 4 liters these past few days. Pt is now on 4 L of oxygen at 94 sat. Pt States she had her right lung removed in 1979. Pt denies any pain at this time.

## 2017-01-24 NOTE — Progress Notes (Signed)
Pharmacist - Prescriber Communication  1. Levofloxacin dose modified to 750 mg IV Q48H due to CrCl 20 to 49 mL/min. 2. Naproxen dose modifed to 250 mg po BID with meals due to inventory.  Zori Benbrook A. Darden, [place].D., BCPS Clinical Pharmacist 01/24/2017 13:41

## 2017-01-24 NOTE — ED Provider Notes (Signed)
[facility] Emergency Department Provider Note  Time seen: 10:07 AM  I have reviewed the triage vital signs and the nursing notes.   HISTORY  Chief Complaint Respiratory Distress    HPI Kristie [name] is a 83 y.o. female with a past medical history of COPD, partial right-sided lobectomy many years ago, presents to the emergency department for difficulty breathing.  According to the patient over the past 3 days she has been experiencing difficulty breathing with cough, no sputum production or fever.  Patient denies any chest pain.  States that shortness of breath had worsened so she came to the emergency department for evaluation.  Upon arrival patient quite tachypneic in the 30s with a saturation of 90% on 2 L.  Patient wears 2-3 L of oxygen at home 24/7.  Past Medical History:  Diagnosis Date  . COPD (chronic obstructive pulmonary disease) ([place])   . Glaucoma   . Restless legs     Patient Active Problem List   Diagnosis Date Noted  . Acute on chronic respiratory failure with hypoxia ([place]) 03/28/2016  . Community acquired pneumonia 03/28/2016  . COPD exacerbation ([place]) 02/12/2016    Past Surgical History:  Procedure Laterality Date  . APPENDECTOMY    . LUNG REMOVAL, PARTIAL Right    squamous cell carcinoma, all lobes on the right were removed.  . TONSILLECTOMY      Prior to Admission medications   Medication Sig Start Date End Date Taking? Authorizing Provider  gabapentin (NEURONTIN) 300 MG capsule Take 300 mg by mouth every evening. 11/05/14  Yes [provider]  latanoprost (XALATAN) 0.005 % ophthalmic solution Place 1 drop into both eyes every evening. 10/01/14  Yes [provider]  rOPINIRole (REQUIP) 1 MG tablet Take 1 mg by mouth at bedtime. 09/29/14  Yes [provider]  SPIRIVA HANDIHALER 18 MCG inhalation capsule Place 18 mcg into inhaler and inhale every evening. 11/13/14  Yes [provider]   acetaminophen-codeine (TYLENOL #3) 300-30 MG per tablet Take 1 tablet by mouth every 6 (six) hours as needed for moderate pain. Patient not taking: Reported on 01/24/2017 11/28/14   [name], MD  chlorpheniramine (CHLOR-TRIMETON) 4 MG tablet Take 4 mg by mouth every evening.    [provider]  naproxen sodium (ALEVE) 220 MG tablet Take 220 mg by mouth 2 (two) times daily with a meal.    [provider]  predniSONE (DELTASONE) 10 MG tablet Label  & dispense according to the schedule below. 5 Pills PO for 1 day then, 4 Pills PO for 1 day, 3 Pills PO for 1 day, 2 Pills PO for 1 day, 1 Pill PO for 1 days then STOP. Patient not taking: Reported on 01/24/2017 04/01/16   Henreitta Leber, MD    No Known Allergies  Family History  Problem Relation Age of Onset  . Bladder Cancer Mother   . CAD Father     Social History Social History   Tobacco Use  . Smoking status: Former Research scientist (life sciences)  . Smokeless tobacco: Never Used  Substance Use Topics  . Alcohol use: Yes    Comment: occasionally  . Drug use: Not on file    Review of Systems Constitutional: Negative for fever. Cardiovascular: Negative for chest pain. Respiratory: Positive for shortness of breath.  Positive for cough. Gastrointestinal: Negative for abdominal pain, vomiting Musculoskeletal: Negative for leg pain or swelling. Neurological: Negative for headache All other ROS negative  ____________________________________________   PHYSICAL EXAM:  VITAL SIGNS: ED Triage Vitals  Enc Vitals Group     BP 01/24/17 0923 (!) 148/71     Pulse Rate 01/24/17 0923 (!) 107     Resp 01/24/17 0935 (!) 38     Temp 01/24/17 0923 98.1 F (36.7 C)     Temp Source 01/24/17 0923 Oral     SpO2 01/24/17 0923 90 %     Weight 01/24/17 0925 130 lb (59 kg)     Height 01/24/17 0925 5\' 4"  (1.626 m)     Head Circumference --      Peak Flow --      Pain Score --      Pain Loc --      Pain Edu? --      Excl. in [place]? --     Constitutional: Alert and oriented. Well appearing and in no distress. Eyes: Normal exam ENT   Head: Normocephalic and atraumatic.   Mouth/Throat: Mucous membranes are moist. Cardiovascular: Normal rate, regular rhythm. No murmur Respiratory: Normal respiratory effort without tachypnea nor retractions. Breath sounds are clear Gastrointestinal: Soft and nontender. No distention.  Musculoskeletal: Nontender with normal range of motion in all extremities. No lower extremity tenderness or edema. Neurologic:  Normal speech and language. No gross focal neurologic deficits  Skin:  Skin is warm, dry and intact.  Psychiatric: Mood and affect are normal.  ____________________________________________    EKG  EKG reviewed and interpreted by myself shows sinus tachycardia 104 bpm with a narrow QRS, right axis deviation, nonspecific ST changes.  ____________________________________________    RADIOLOGY  IMPRESSION: 1. 1.8 x 1.0 cm nodular opacity left upper lobe, not seen previously. Advise noncontrast enhanced chest CT to further assess. Neoplastic focus in the left upper lobe must be of concern in this circumstance.  2. On the right, there is diffuse opacification and calcification, likely due to chronic fibrosis with questionable associated chronic effusion. 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  3. Fibrosis throughout the left lung with scarring left base.  4. Stable cardiac silhouette. Prominence of pulmonary vascularity on the left is likely due to redistribution of blood flow to viable lung segments.  5. Aortic atherosclerosis.  6. Bones osteoporotic.  Aortic Atherosclerosis (ICD10-I70.0).  ____________________________________________   INITIAL IMPRESSION / ASSESSMENT AND PLAN / ED COURSE  Pertinent labs & imaging results that were available during my care of the patient were reviewed by me and considered  in my medical decision making (see chart for details).  Patient presents to the emergency department for difficulty breathing.  Differential would include COPD exacerbation, pneumonia, pneumothorax.  Patient states some relief after breathing treatment.  Chest x-ray shows a left upper lobe nodule not present on prior x-ray we will obtain a CT scan to further evaluate.  We will check labs including cardiac enzymes, dose Solu-Medrol and continue to monitor in the emergency department.  I have reviewed the patient's records including discharge summary 04/01/16, which the patient was admitted for pneumonia/COPD.  CT scan shows concerning nodule for lung cancer.  Also shows what appears to be consistent multi lobar pneumonia which fits the clinical picture observed today with hypoxia and tachypnea.  Patient satting in the low 90s on 4 L.  Occasionally will desat as low as 89% on 4 L.  Remains tachypneic around 30 breaths/min.  Patient's labs show a leukocytosis of 13,000.  Given her leukocytosis and concerning x-ray we will cover with antibiotics.  The patient will be  admitted to the hospital for further treatment.  Patient is agreeable to this plan.  I discussed the lung nodule/mass with the patient that we will need outpatient follow-up.  EKG shows sinus tachycardia 104 bpm.  Narrow QRS, right axis deviation, largely normal intervals with nonspecific ST changes without ST elevation. ____________________________________________   FINAL CLINICAL IMPRESSION(S) / ED DIAGNOSES  Dyspnea Community-acquired pneumonia Lung mass   Harvest Dark, MD 01/24/17 1506

## 2017-01-25 LAB — COMPREHENSIVE METABOLIC PANEL
ALBUMIN: 3.4 g/dL — AB (ref 3.5–5.0)
ALT: 15 U/L (ref 14–54)
ANION GAP: 11 (ref 5–15)
AST: 22 U/L (ref 15–41)
Alkaline Phosphatase: 62 U/L (ref 38–126)
BUN: 19 mg/dL (ref 6–20)
CHLORIDE: 97 mmol/L — AB (ref 101–111)
CO2: 28 mmol/L (ref 22–32)
Calcium: 8.6 mg/dL — ABNORMAL LOW (ref 8.9–10.3)
Creatinine, Ser: 0.56 mg/dL (ref 0.44–1.00)
GFR calc Af Amer: >60 mL/min (ref >60)
GFR calc non Af Amer: >60 mL/min (ref >60)
GLUCOSE: 148 mg/dL — AB (ref 65–99)
POTASSIUM: 4.1 mmol/L (ref 3.5–5.1)
Sodium: 136 mmol/L (ref 135–145)
Total Bilirubin: 1.1 mg/dL (ref 0.3–1.2)
Total Protein: 6.2 g/dL — ABNORMAL LOW (ref 6.5–8.1)

## 2017-01-25 LAB — CBC
HEMATOCRIT: 44.1 % (ref 35.0–47.0)
HEMOGLOBIN: 14.8 g/dL (ref 12.0–16.0)
MCH: 29.7 pg (ref 26.0–34.0)
MCHC: 33.6 g/dL (ref 32.0–36.0)
MCV: 88.5 fL (ref 80.0–100.0)
Platelets: 156 10*3/uL (ref 150–440)
RBC: 4.98 MIL/uL (ref 3.80–5.20)
RDW: 13.7 % (ref 11.5–14.5)
WBC: 10.9 10*3/uL (ref 3.6–11.0)

## 2017-01-25 LAB — TROPONIN I: TROPONIN I: 0.04 ng/mL — AB (ref <0.03)

## 2017-01-25 LAB — STREP PNEUMONIAE URINARY ANTIGEN: STREP PNEUMO URINARY ANTIGEN: NEGATIVE

## 2017-01-25 MED ORDER — SODIUM CHLORIDE 0.9% FLUSH
3.0000 mL | INTRAVENOUS | Status: DC | PRN
  Administered 2017-01-25: 09:00:00 3 mL via INTRAVENOUS

## 2017-01-25 MED ORDER — SODIUM CHLORIDE 0.9% FLUSH
3.0000 mL | Freq: Two times a day (BID) | INTRAVENOUS | Status: DC
  Administered 2017-01-25 – 2017-01-26 (×5): 3 mL via INTRAVENOUS

## 2017-01-25 NOTE — Plan of Care (Signed)
Reports she is feeling better; respirations easy/even. Unable to produce sputum for sputum collection with cup at bedside with pt educated.  Up in chair x 2 hours and tolerated well. Husband in. Pt speaks openly about other lung lesion. Pt states she hopes to be discharged tomorrow.

## 2017-01-25 NOTE — Progress Notes (Signed)
PT Cancellation Note  Patient Details Name: Kristie [name] MRN: [id number] DOB: 03/09/1927   Cancelled Treatment:    Reason Eval/Treat Not Completed: Other (comment).  PT consult received.  Chart reviewed.  Per MD Patel's note this morning, "Pt is self ambulatory.  She is not wanting PT.  No falls at home."  Per verbal discussion with MD Posey Pronto, cancel PT order.  Leitha Bleak, PT 01/25/17, 8:10 AM [phone]

## 2017-01-25 NOTE — Progress Notes (Signed)
[facility] NAME: Kristie [name]    MR#:  [id number]  DATE OF BIRTH:  02-18-1927  SUBJECTIVE:  Came in with increasing SOB and dry cough No cp  REVIEW OF SYSTEMS:   Review of Systems  Constitutional: Negative for chills, fever and weight loss.  HENT: Negative for ear discharge, ear pain and nosebleeds.   Eyes: Negative for blurred vision, pain and discharge.  Respiratory: Positive for cough and shortness of breath. Negative for sputum production, wheezing and stridor.   Cardiovascular: Negative for chest pain, palpitations, orthopnea and PND.  Gastrointestinal: Negative for abdominal pain, diarrhea, nausea and vomiting.  Genitourinary: Negative for frequency and urgency.  Musculoskeletal: Negative for back pain and joint pain.  Neurological: Positive for weakness. Negative for sensory change, speech change and focal weakness.  Psychiatric/Behavioral: Negative for depression and hallucinations. The Kristie [name] is not nervous/anxious.    Tolerating Diet:yes Tolerating PT: ambulatory  DRUG ALLERGIES:   Allergies  Allergen Reactions  . Levaquin [Levofloxacin] Other (See Comments)    Intravenous. Kristie [name] has tolerated oral in the past.    VITALS:  Blood pressure (!) 135/58, pulse 100, temperature 98.3 F (36.8 C), temperature source Oral, resp. rate 20, height 5\' 4"  (1.626 m), weight 59.8 kg (131 lb 14.4 oz), SpO2 91 %.  PHYSICAL EXAMINATION:   Physical Exam  GENERAL:  83 y.o.-year-old Kristie [name] lying in the bed with no acute distress.  EYES: Pupils equal, round, reactive to light and accommodation. No scleral icterus. Extraocular muscles intact.  HEENT: Head atraumatic, normocephalic. Oropharynx and nasopharynx clear.  NECK:  Supple, no jugular venous distention. No thyroid enlargement, no tenderness.  LUNGS: mild coarse breath sounds bilaterally, no wheezing, rales, rhonchi. No use of accessory muscles of respiration.   CARDIOVASCULAR: S1, S2 normal. No murmurs, rubs, or gallops.  ABDOMEN: Soft, nontender, nondistended. Bowel sounds present. No organomegaly or mass.  EXTREMITIES: No cyanosis, clubbing or edema b/l.    NEUROLOGIC: Cranial nerves II through XII are intact. No focal Motor or sensory deficits b/l.   PSYCHIATRIC:  Kristie [name] is alert and oriented x 3.  SKIN: No obvious rash, lesion, or ulcer.   LABORATORY PANEL:  CBC Recent Labs  Lab 01/25/17 0128  WBC 10.9  HGB 14.8  HCT 44.1  PLT 156    Chemistries  Recent Labs  Lab 01/25/17 0128  NA 136  K 4.1  CL 97*  CO2 28  GLUCOSE 148*  BUN 19  CREATININE 0.56  CALCIUM 8.6*  AST 22  ALT 15  ALKPHOS 62  BILITOT 1.1   Cardiac Enzymes Recent Labs  Lab 01/25/17 0128  TROPONINI 0.04*   RADIOLOGY:  Dg Chest 2 View  Result Date: 01/24/2017 CLINICAL DATA:  Shortness of Breath EXAM: CHEST  2 VIEW COMPARISON:  March 28, 2016 FINDINGS: There is opacification of the right hemithorax. 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 There is postoperative change on the left. There is fibrosis throughout much of the left lung with scarring in the left base. There is a nodular opacity in the left upper lobe measuring 1.8 x 1.0 cm, not present previously. Heart is upper normal in size with mild prominence of the pulmonary vascularity on the left, likely due to blood flow essentially exclusively handle by the left lung. There is aortic atherosclerosis. No adenopathy evident. There are no evident bone lesions. Bones are osteoporotic. IMPRESSION:  1. 1.8 x 1.0 cm nodular opacity left upper lobe, not seen previously. Advise noncontrast enhanced chest CT to further assess. Neoplastic focus in the left upper lobe must be of concern in this circumstance. 2. On the right, there is diffuse opacification and calcification, likely due to chronic fibrosis with questionable  associated chronic effusion. 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 3.  Fibrosis throughout the left lung with scarring left base. 4. Stable cardiac silhouette. Prominence of pulmonary vascularity on the left is likely due to redistribution of blood flow to viable lung segments. 5.  Aortic atherosclerosis. 6.  Bones osteoporotic. Aortic Atherosclerosis (ICD10-I70.0). Electronically Signed   By: Lowella Grip III M.D.   On: 01/24/2017 09:58   Ct Chest Wo Contrast  Result Date: 01/24/2017 CLINICAL DATA:  New left upper lung nodule on chest radiograph. Remote history of right pneumonectomy for lung cancer. EXAM: CT CHEST WITHOUT CONTRAST TECHNIQUE: Multidetector CT imaging of the chest was performed following the standard protocol without IV contrast. COMPARISON:  Chest radiograph from earlier today. 05/23/2004 chest CT. FINDINGS: Motion degraded scan. Cardiovascular: Top-normal heart size. Trace pericardial effusion/thickening is stable. Left main, left anterior descending and right coronary atherosclerosis. Atherosclerotic nonaneurysmal thoracic aorta. Dilated main pulmonary artery (3.5 cm diameter). Mediastinum/Nodes: Hypodense 0.8 cm right thyroid lobe nodule. Mildly patulous thoracic esophagus with small fluid level in the mid to lower thoracic esophagus. No axillary adenopathy. Mildly enlarged 1.1 cm AP window node (series 2/ image 47), stable since 05/23/2004 chest CT. No new pathologically enlarged mediastinal or discrete hilar nodes on this noncontrast scan. Lungs/Pleura: Status post right pneumonectomy. Stable chronic circumferential right pleural thickening and coarse right pleural calcification with stable loculated small right pleural effusion. No left pneumothorax. No left pleural effusion. Moderate centrilobular emphysema in the left lung. Irregular solid 1.4 x 1.0 cm anterior left upper lobe pulmonary nodule (series 3/ image 22), increased  from 0.7 x 0.6 cm on 05/23/2004. New patchy peribronchovascular consolidation in the posterior left upper lobe (series 3/ image 45) and throughout the left lower lobe, with associated scattered parenchymal banding. Upper abdomen: Small simple 1.0 cm anterior left liver lobe cyst. Musculoskeletal: No aggressive appearing focal osseous lesions. Moderate thoracic spondylosis. IMPRESSION: 1. Irregular solid 1.4 cm anterior left upper lobe pulmonary nodule, increased in size from 0.7 cm on 10/2004 chest CT, suspicious for a slowly growing primary bronchogenic carcinoma. PET-CT is indicated for further characterization and can be performed as clinically warranted. 2. New patchy peribronchovascular consolidation in the posterior left upper lobe and throughout the left lower lobe with associated scattered parenchymal banding, favor multilobar pneumonia superimposed on chronic postinfectious scarring. Recommend attention on follow-up post treatment chest CT in 3 months. 3. 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 4. No findings suspicious for metastatic disease in the chest. Mild AP window lymphadenopathy is stable since 2006, most compatible with benign reactive adenopathy. 5. Dilated main pulmonary artery, suggesting pulmonary arterial hypertension. 6. Left main and two-vessel coronary atherosclerosis. Aortic Atherosclerosis (ICD10-I70.0) and Emphysema (ICD10-J43.9). Electronically Signed   By: Ilona Sorrel M.D.   On: 01/24/2017 11:14   ASSESSMENT AND PLAN:  Kristie [name] is an 83 year old female with history of chronic respiratory failure lives on 2 L of oxygen, COPD, history of lung cancer status post right pneumonectomy in 1979, not followed by any oncologist, has been using 4 L of oxygen for the past few days is presenting to the ED with a chief complaint of  worsening of shortness of breath.  # Sepsis-meets criteria with leukocytosis and tachycardia Sepsis from pneumonia left UL PO levofloxacin  750 mg qod Sputum culture and sensitivity if we can obtain sputum sample---unable to do due to dry Bronchodilator therapy and supportive treatment Consult placed to pulmonology --Dr Laurelyn Sickle -wean to 3 liter [place]   # Elevated troponin could be demand ischemia from sepsis -Kristie [name] denies any chest pain.   # Cancerous looking lung nodule on the left  Lung -Pulmonology consult is placed to Dr. Tami [name] khan---PET scan as out pt  -outpatient follow-up with oncology as required  # Restless leg syndrome continue Requip  # COPD no exacerbation continue Spiriva and breathing treatments as needed -on chronic home oxygen 2-3 Liter [place] oxygen -cont inhalers and nebs prn  Pt is self ambulatory. She is not wanting PT. No falls at home  Case discussed with Care Management/Social Worker. Management plans discussed with the Kristie [name], family and they are in agreement.  CODE STATUS: DNR  DVT Prophylaxis: lovenox  TOTAL TIME TAKING CARE OF THIS Kristie [name]: 30 minutes.  >50% time spent on counselling and coordination of care  POSSIBLE D/C IN 1-2 DAYS, DEPENDING ON CLINICAL CONDITION.  Note: This dictation was prepared with Dragon dictation along with smaller phrase technology. Any transcriptional errors that result from this process are unintentional.  Gertrude Bucks M.D on 01/25/2017 at 7:31 AM  Between 7am to 6pm - Pager - [phone]  After 6pm go to [email or web address] - password [email or web address] [facility]  Office  [phone]  CC: Primary care physician; Leonel Ramsay, MD

## 2017-01-26 LAB — HIV ANTIBODY (ROUTINE TESTING W REFLEX): HIV SCREEN 4TH GENERATION: NONREACTIVE

## 2017-01-26 LAB — EXPECTORATED SPUTUM ASSESSMENT W REFEX TO RESP CULTURE

## 2017-01-26 LAB — EXPECTORATED SPUTUM ASSESSMENT W GRAM STAIN, RFLX TO RESP C

## 2017-01-26 MED ORDER — LEVOFLOXACIN 750 MG PO TABS: 750.0000 mg | ORAL_TABLET | ORAL | 0 refills | Status: DC

## 2017-01-26 NOTE — Discharge Instructions (Signed)
Community-Acquired Pneumonia, Adult °Pneumonia is an infection of the lungs. There are different types of pneumonia. One type can develop while a person is in a hospital. A different type, called community-acquired pneumonia, develops in people who are not, or have not recently been, in the hospital or other health care facility. °What are the causes? °Pneumonia may be caused by bacteria, viruses, or funguses. Community-acquired pneumonia is often caused by Streptococcus pneumonia bacteria. These bacteria are often passed from one person to another by breathing in droplets from the cough or sneeze of an infected person. °What increases the risk? °The condition is more likely to develop in: °· People who have chronic diseases, such as chronic obstructive pulmonary disease (COPD), asthma, congestive heart failure, cystic fibrosis, diabetes, or kidney disease. °· People who have early-stage or late-stage HIV. °· People who have sickle cell disease. °· People who have had their spleen removed (splenectomy). °· People who have poor dental hygiene. °· People who have medical conditions that increase the risk of breathing in (aspirating) secretions their own mouth and nose. °· People who have a weakened immune system (immunocompromised). °· People who smoke. °· People who travel to areas where pneumonia-causing germs commonly exist. °· People who are around animal habitats or animals that have pneumonia-causing germs, including birds, bats, rabbits, cats, and farm animals. ° °What are the signs or symptoms? °Symptoms of this condition include: °· A dry cough. °· A wet (productive) cough. °· Fever. °· Sweating. °· Chest pain, especially when breathing deeply or coughing. °· Rapid breathing or difficulty breathing. °· Shortness of breath. °· Shaking chills. °· Fatigue. °· Muscle aches. ° °How is this diagnosed? °Your health care provider will take a medical history and perform a physical exam. You may also have other tests,  including: °· Imaging studies of your chest, including X-rays. °· Tests to check your blood oxygen level and other blood gases. °· Other tests on blood, mucus (sputum), fluid around your lungs (pleural fluid), and urine. ° °If your pneumonia is severe, other tests may be done to identify the specific cause of your illness. °How is this treated? °The type of treatment that you receive depends on many factors, such as the cause of your pneumonia, the medicines you take, and other medical conditions that you have. For most adults, treatment and recovery from pneumonia may occur at home. In some cases, treatment must happen in a hospital. Treatment may include: °· Antibiotic medicines, if the pneumonia was caused by bacteria. °· Antiviral medicines, if the pneumonia was caused by a virus. °· Medicines that are given by mouth or through an IV tube. °· Oxygen. °· Respiratory therapy. ° °Although rare, treating severe pneumonia may include: °· Mechanical ventilation. This is done if you are not breathing well on your own and you cannot maintain a safe blood oxygen level. °· Thoracentesis. This procedure removes fluid around one lung or both lungs to help you breathe better. ° °Follow these instructions at home: °· Take over-the-counter and prescription medicines only as told by your health care provider. °? Only take cough medicine if you are losing sleep. Understand that cough medicine can prevent your body’s natural ability to remove mucus from your lungs. °? If you were prescribed an antibiotic medicine, take it as told by your health care provider. Do not stop taking the antibiotic even if you start to feel better. °· Sleep in a semi-upright position at night. Try sleeping in a reclining chair, or place a few pillows under your head. °· Do not use tobacco products, including cigarettes, chewing   tobacco, and e-cigarettes. If you need help quitting, ask your health care provider. °· Drink enough water to keep your urine  clear or pale yellow. This will help to thin out mucus secretions in your lungs. °How is this prevented? °There are ways that you can decrease your risk of developing community-acquired pneumonia. Consider getting a pneumococcal vaccine if: °· You are older than 81 years of age. °· You are older than 81 years of age and are undergoing cancer treatment, have chronic lung disease, or have other medical conditions that affect your immune system. Ask your health care provider if this applies to you. ° °There are different types and schedules of pneumococcal vaccines. Ask your health care provider which vaccination option is best for you. °You may also prevent community-acquired pneumonia if you take these actions: °· Get an influenza vaccine every year. Ask your health care provider which type of influenza vaccine is best for you. °· Go to the dentist on a regular basis. °· Wash your hands often. Use hand sanitizer if soap and water are not available. ° °Contact a health care provider if: °· You have a fever. °· You are losing sleep because you cannot control your cough with cough medicine. °Get help right away if: °· You have worsening shortness of breath. °· You have increased chest pain. °· Your sickness becomes worse, especially if you are an older adult or have a weakened immune system. °· You cough up blood. °This information is not intended to replace advice given to you by your health care provider. Make sure you discuss any questions you have with your health care provider. °Document Released: 03/04/2005 Document Revised: 07/13/2015 Document Reviewed: 06/29/2014 °Elsevier Interactive Patient Education © 2017 [employer] ° °

## 2017-01-26 NOTE — Plan of Care (Signed)
Reports feeling well and ready to go home. Cough up very scant amount clear with yellow with lab rejecting specimen. Oral and written AVS instructions completed with pt and husband with stated understanding. Husband has pt's portable 02. Discharge home to self/family care with own 02. Transported in transport chair to private vehicle.

## 2017-01-26 NOTE — Discharge Summary (Signed)
[facility] [place] NAME: Makaelah Cranfield    MR#:  [id number]  DATE OF BIRTH:  01/24/1927  DATE OF ADMISSION:  01/24/2017 ADMITTING PHYSICIAN: Nicholes Mango, MD  DATE OF DISCHARGE: 01/26/17  PRIMARY CARE PHYSICIAN: Leonel Ramsay, MD    ADMISSION DIAGNOSIS:  Shortness of breath [R06.02] Hypoxia [R09.02] Community acquired pneumonia of left lung, unspecified part of lung [J18.9]  DISCHARGE DIAGNOSIS:  Left UL pneumonia Left UL lung mass/nodule---pt needs PET scan as out pt. Defer to PCP/Pulmonologist  SECONDARY DIAGNOSIS:   Past Medical History:  Diagnosis Date  . COPD (chronic obstructive pulmonary disease) ([place])   . Glaucoma   . Restless legs     HOSPITAL COURSE:  Ms. Kovacevic is an 83 year old female with history of chronic respiratory failure lives on 2 L of oxygen, COPD, history of lung cancer status post right pneumonectomy in 1979, not followed by any oncologist, has been using 4 L of oxygen for the past few days is presenting to the ED with a chief complaint of worsening of shortness of breath.  # Sepsis-meets criteria with leukocytosis and tachycardia Sepsis from pneumonia left UL PO levofloxacin 750 mg qod Sputum culture and sensitivity if we can obtain sputum sample---unable to do due to dry Bronchodilator therapy and supportive treatment Consult placed to pulmonology --Dr Chauncey Cruel khan--DID NOT COME SEE PATIENT, Consult was called by unit secretary. -wean to 3 liter [place]  # Elevated troponin could be demand ischemia from sepsis -Patient denies any chest pain.   # Cancerous looking lung nodule on the left Lung -Pulmonology consult is placed to Dr. Tami [name] khan---PET scan as out pt. (pt advised to call) -outpatient follow-up with oncology as required  # Restless leg syndrome continue Requip  # COPD no exacerbation continue Spiriva and breathing treatments as needed -on chronic home oxygen 2-3 Liter [place]  oxygen -cont inhalers and nebs prn  Pt is self ambulatory. She is not wanting PT. No falls at home   Overall at baseline. D/c home  CONSULTS OBTAINED:  Treatment Team:  Leonel Ramsay, MD Allyne Gee, MD  DRUG ALLERGIES:   Allergies  Allergen Reactions  . Levaquin [Levofloxacin] Other (See Comments)    Intravenous. Patient has tolerated oral in the past.    DISCHARGE MEDICATIONS:   Current Discharge Medication List    START taking these medications   Details  levofloxacin (LEVAQUIN) 750 MG tablet Take 1 tablet (750 mg total) every other day by mouth. Qty: 4 tablet, Refills: 0      CONTINUE these medications which have NOT CHANGED   Details  gabapentin (NEURONTIN) 300 MG capsule Take 300 mg by mouth every evening. Refills: 11    latanoprost (XALATAN) 0.005 % ophthalmic solution Place 1 drop into both eyes every evening. Refills: 3    rOPINIRole (REQUIP) 1 MG tablet Take 1 mg by mouth at bedtime. Refills: 3    SPIRIVA HANDIHALER 18 MCG inhalation capsule Place 18 mcg into inhaler and inhale every evening. Refills: 5    acetaminophen-codeine (TYLENOL #3) 300-30 MG per tablet Take 1 tablet by mouth every 6 (six) hours as needed for moderate pain. Qty: 12 tablet, Refills: 0    chlorpheniramine (CHLOR-TRIMETON) 4 MG tablet Take 4 mg by mouth every evening.    naproxen sodium (ALEVE) 220 MG tablet Take 220 mg by mouth 2 (two) times daily with a meal.      STOP taking these medications  predniSONE (DELTASONE) 10 MG tablet         If you experience worsening of your admission symptoms, develop shortness of breath, life threatening emergency, suicidal or homicidal thoughts you must seek medical attention immediately by calling 911 or calling your MD immediately  if symptoms less severe.  You Must read complete instructions/literature along with all the possible adverse reactions/side effects for all the Medicines you take and that have been prescribed  to you. Take any new Medicines after you have completely understood and accept all the possible adverse reactions/side effects.   Please note  You were cared for by a hospitalist during your hospital stay. If you have any questions about your discharge medications or the care you received while you were in the hospital after you are discharged, you can call the unit and asked to speak with the hospitalist on call if the hospitalist that took care of you is not available. Once you are discharged, your primary care physician will handle any further medical issues. Please note that NO REFILLS for any discharge medications will be authorized once you are discharged, as it is imperative that you return to your primary care physician (or establish a relationship with a primary care physician if you do not have one) for your aftercare needs so that they can reassess your need for medications and monitor your lab values. Today   SUBJECTIVE  Cough+  VITAL SIGNS:  Blood pressure 138/60, pulse 94, temperature 98.1 F (36.7 C), temperature source Oral, resp. rate 20, height 5\' 4"  (1.626 m), weight 59.8 kg (131 lb 14.4 oz), SpO2 94 %.  I/O:    Intake/Output Summary (Last 24 hours) at 01/26/2017 0741 Last data filed at 01/25/2017 1834 Gross per 24 hour  Intake 840 ml  Output -  Net 840 ml    PHYSICAL EXAMINATION:  GENERAL:  83 y.o.-year-old patient lying in the bed with no acute distress.  EYES: Pupils equal, round, reactive to light and accommodation. No scleral icterus. Extraocular muscles intact.  HEENT: Head atraumatic, normocephalic. Oropharynx and nasopharynx clear.  NECK:  Supple, no jugular venous distention. No thyroid enlargement, no tenderness.  LUNGS: Normal breath sounds bilaterally, no wheezing, rales,rhonchi or crepitation. No use of accessory muscles of respiration.  CARDIOVASCULAR: S1, S2 normal. No murmurs, rubs, or gallops.  ABDOMEN: Soft, non-tender, non-distended. Bowel sounds  present. No organomegaly or mass.  EXTREMITIES: No pedal edema, cyanosis, or clubbing.  NEUROLOGIC: Cranial nerves II through XII are intact. Muscle strength 5/5 in all extremities. Sensation intact. Gait not checked.  PSYCHIATRIC: The patient is alert and oriented x 3.  SKIN: No obvious rash, lesion, or ulcer.   DATA REVIEW:   CBC  Recent Labs  Lab 01/25/17 0128  WBC 10.9  HGB 14.8  HCT 44.1  PLT 156    Chemistries  Recent Labs  Lab 01/25/17 0128  NA 136  K 4.1  CL 97*  CO2 28  GLUCOSE 148*  BUN 19  CREATININE 0.56  CALCIUM 8.6*  AST 22  ALT 15  ALKPHOS 62  BILITOT 1.1    Microbiology Results   Recent Results (from the past 240 hour(s))  Blood culture (routine x 2)     Status: None (Preliminary result)   Collection Time: 01/24/17 12:01 PM  Result Value Ref Range Status   Specimen Description BLOOD LEFT ANTECUBITAL  Final   Special Requests   Final    BOTTLES DRAWN AEROBIC AND ANAEROBIC Blood Culture adequate volume   Culture  NO GROWTH 2 DAYS  Final   Report Status PENDING  Incomplete  Blood culture (routine x 2)     Status: None (Preliminary result)   Collection Time: 01/24/17 12:01 PM  Result Value Ref Range Status   Specimen Description BLOOD BLOOD LEFT HAND  Final   Special Requests   Final    BOTTLES DRAWN AEROBIC AND ANAEROBIC Blood Culture adequate volume   Culture NO GROWTH 2 DAYS  Final   Report Status PENDING  Incomplete    RADIOLOGY:  Dg Chest 2 View  Result Date: 01/24/2017 CLINICAL DATA:  Shortness of Breath EXAM: CHEST  2 VIEW COMPARISON:  March 28, 2016 FINDINGS: There is opacification of the right hemithorax. 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 There is postoperative change on the left. There is fibrosis throughout much of the left lung with scarring in the left base. There is a nodular opacity in the left upper lobe measuring 1.8 x 1.0 cm,  not present previously. Heart is upper normal in size with mild prominence of the pulmonary vascularity on the left, likely due to blood flow essentially exclusively handle by the left lung. There is aortic atherosclerosis. No adenopathy evident. There are no evident bone lesions. Bones are osteoporotic. IMPRESSION: 1. 1.8 x 1.0 cm nodular opacity left upper lobe, not seen previously. Advise noncontrast enhanced chest CT to further assess. Neoplastic focus in the left upper lobe must be of concern in this circumstance. 2. On the right, there is diffuse opacification and calcification, likely due to chronic fibrosis with questionable associated chronic effusion. 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 3.  Fibrosis throughout the left lung with scarring left base. 4. Stable cardiac silhouette. Prominence of pulmonary vascularity on the left is likely due to redistribution of blood flow to viable lung segments. 5.  Aortic atherosclerosis. 6.  Bones osteoporotic. Aortic Atherosclerosis (ICD10-I70.0). Electronically Signed   By: Lowella Grip III M.D.   On: 01/24/2017 09:58   Ct Chest Wo Contrast  Result Date: 01/24/2017 CLINICAL DATA:  New left upper lung nodule on chest radiograph. Remote history of right pneumonectomy for lung cancer. EXAM: CT CHEST WITHOUT CONTRAST TECHNIQUE: Multidetector CT imaging of the chest was performed following the standard protocol without IV contrast. COMPARISON:  Chest radiograph from earlier today. 05/23/2004 chest CT. FINDINGS: Motion degraded scan. Cardiovascular: Top-normal heart size. Trace pericardial effusion/thickening is stable. Left main, left anterior descending and right coronary atherosclerosis. Atherosclerotic nonaneurysmal thoracic aorta. Dilated main pulmonary artery (3.5 cm diameter). Mediastinum/Nodes: Hypodense 0.8 cm right thyroid lobe nodule. Mildly patulous thoracic esophagus with small fluid level in the  mid to lower thoracic esophagus. No axillary adenopathy. Mildly enlarged 1.1 cm AP window node (series 2/ image 47), stable since 05/23/2004 chest CT. No new pathologically enlarged mediastinal or discrete hilar nodes on this noncontrast scan. Lungs/Pleura: Status post right pneumonectomy. Stable chronic circumferential right pleural thickening and coarse right pleural calcification with stable loculated small right pleural effusion. No left pneumothorax. No left pleural effusion. Moderate centrilobular emphysema in the left lung. Irregular solid 1.4 x 1.0 cm anterior left upper lobe pulmonary nodule (series 3/ image 22), increased from 0.7 x 0.6 cm on 05/23/2004. New patchy peribronchovascular consolidation in the posterior left upper lobe (series 3/ image 45) and throughout the left lower lobe, with associated scattered parenchymal banding. Upper abdomen: Small simple 1.0 cm anterior left  liver lobe cyst. Musculoskeletal: No aggressive appearing focal osseous lesions. Moderate thoracic spondylosis. IMPRESSION: 1. Irregular solid 1.4 cm anterior left upper lobe pulmonary nodule, increased in size from 0.7 cm on 10/2004 chest CT, suspicious for a slowly growing primary bronchogenic carcinoma. PET-CT is indicated for further characterization and can be performed as clinically warranted. 2. New patchy peribronchovascular consolidation in the posterior left upper lobe and throughout the left lower lobe with associated scattered parenchymal banding, favor multilobar pneumonia superimposed on chronic postinfectious scarring. Recommend attention on follow-up post treatment chest CT in 3 months. 3. 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 4. No findings suspicious for metastatic disease in the chest. Mild AP window lymphadenopathy is stable since 2006, most compatible with benign reactive adenopathy. 5. Dilated main pulmonary artery, suggesting pulmonary arterial hypertension. 6. Left main and two-vessel  coronary atherosclerosis. Aortic Atherosclerosis (ICD10-I70.0) and Emphysema (ICD10-J43.9). Electronically Signed   By: Ilona Sorrel M.D.   On: 01/24/2017 11:14     Management plans discussed with the patient, family and they are in agreement.  CODE STATUS:     Code Status Orders  (From admission, onward)        Start     Ordered   01/24/17 1339  Do not attempt resuscitation (DNR)  Continuous    Question Answer Comment  In the event of cardiac or respiratory ARREST Do not call a "code blue"   In the event of cardiac or respiratory ARREST Do not perform Intubation, CPR, defibrillation or ACLS   In the event of cardiac or respiratory ARREST Use medication by any route, position, wound care, and other measures to relive pain and suffering. May use oxygen, suction and manual treatment of airway obstruction as needed for comfort.   Comments RN MAY PRONOUNCE      01/24/17 1339    Code Status History    Date Active Date Inactive Code Status Order ID Comments User Context   03/28/2016 11:44 04/01/2016 14:49 DNR [id number]  Theodoro Grist, MD ED   02/12/2016 14:27 02/15/2016 13:29 DNR [id number]  Vaughan Basta, MD Inpatient    Advance Directive Documentation     Most Recent Value  Type of Advance Directive  Healthcare Power of Attorney, Living will, Out of facility DNR (pink MOST or yellow form)  Pre-existing out of facility DNR order (yellow form or pink MOST form)  Yellow form placed in chart (order not valid for inpatient use)  "MOST" Form in Place?  No data      TOTAL TIME TAKING CARE OF THIS PATIENT: *40* minutes.    Tharon Bomar M.D on 01/26/2017 at 7:41 AM  Between 7am to 6pm - Pager - [phone] After 6pm go to [email or web address] - password [email or web address] [facility]  Office  [phone]  CC: Primary care physician; Leonel Ramsay, MD

## 2017-01-29 LAB — CULTURE, BLOOD (ROUTINE X 2)
CULTURE: NO GROWTH
Culture: NO GROWTH
Special Requests: ADEQUATE
Special Requests: ADEQUATE

## 2017-02-01 IMAGING — DX DG CHEST 1V PORT
1 series · 1 of 1 positions shown · non-contrast
Comparison: PA and lateral chest 02/12/2016 and 07/04/2014.

CLINICAL DATA: Worsening shortness of breath since 03/24/2016.

EXAM:
PORTABLE CHEST 1 VIEW

[chest ap]
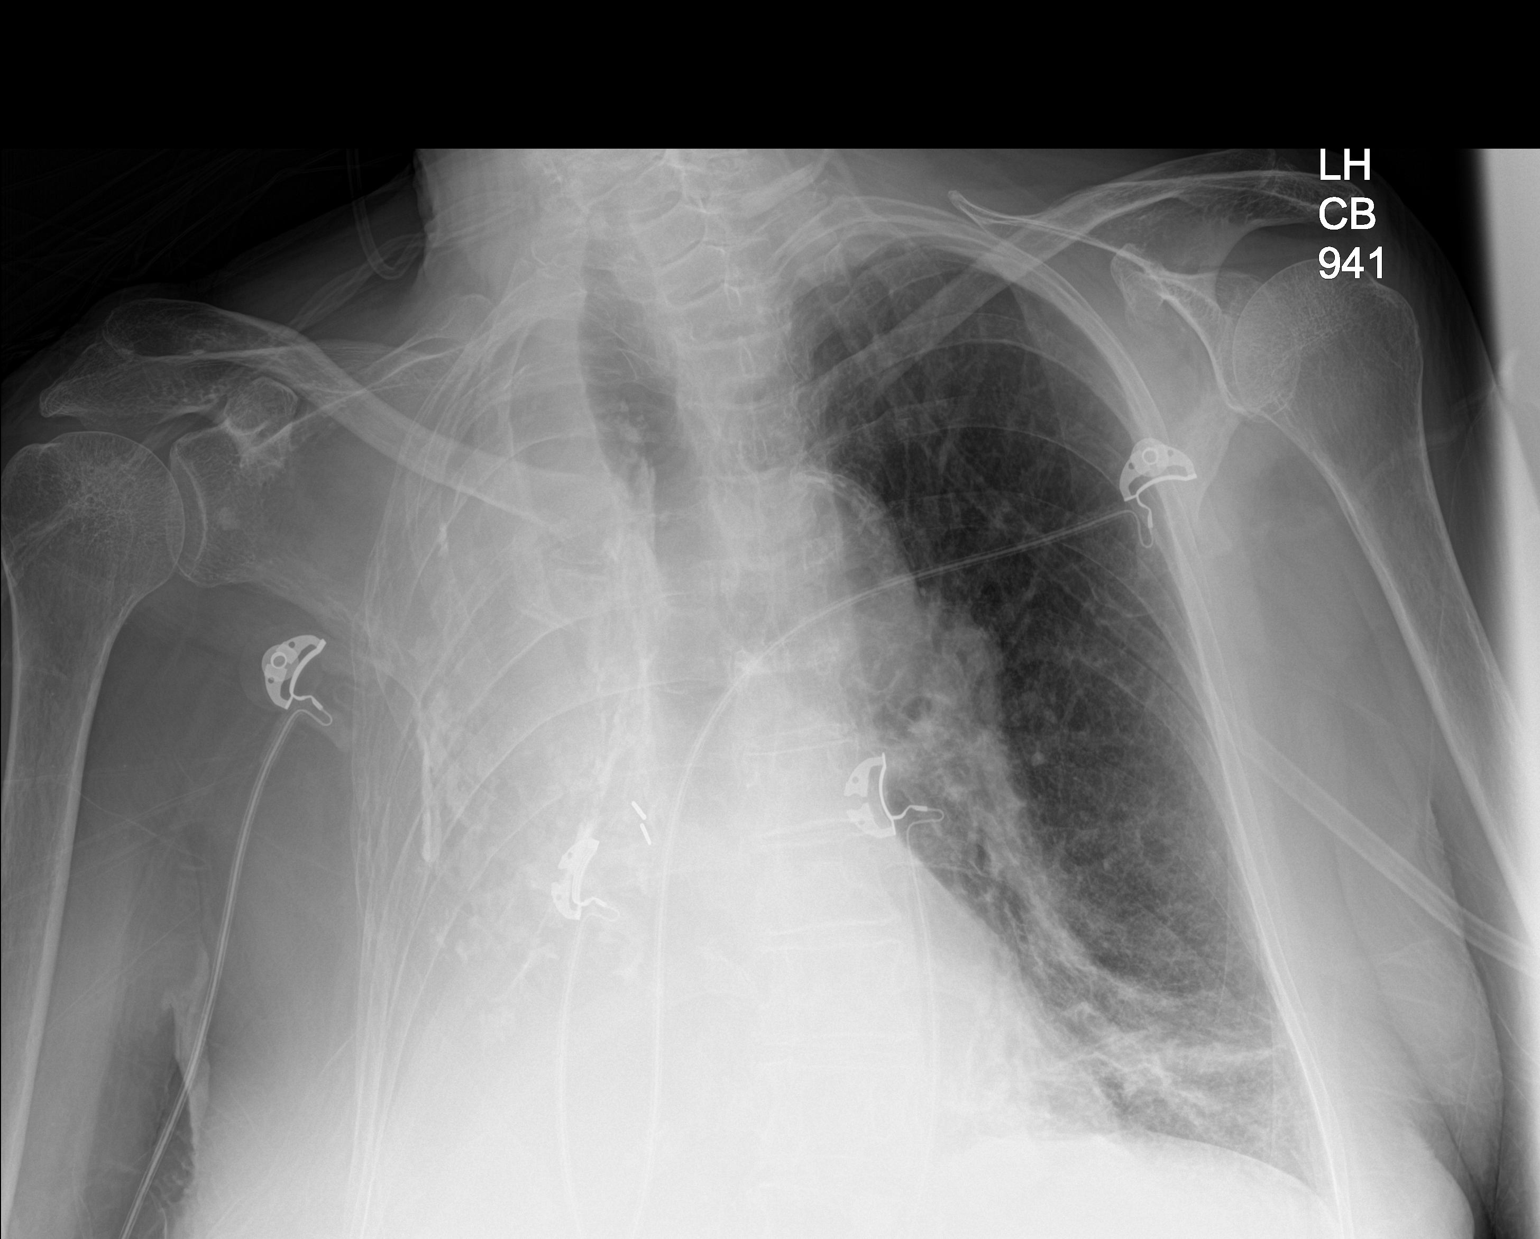

[1 of 1 positions shown; findings below may reference images not displayed]

FINDINGS: Complete white out of the right hemithorax and volume loss
consistent prior pneumonectomy again seen. Streaky left basilar
airspace disease is identified. Cardiac silhouette is obscured.
Aortic atherosclerosis is noted.
IMPRESSION: Streaky left basilar airspace disease could be atelectasis or
pneumonia.

Status post right pneumonectomy.

## 2017-03-10 ENCOUNTER — Encounter: Payer: Self-pay | Admitting: Pulmonary Disease

## 2017-03-19 ENCOUNTER — Encounter: Payer: Self-pay | Admitting: Pulmonary Disease

## 2017-03-19 ENCOUNTER — Ambulatory Visit (INDEPENDENT_AMBULATORY_CARE_PROVIDER_SITE_OTHER): Payer: Medicare Other | Admitting: Pulmonary Disease

## 2017-03-19 VITALS — BP 122/76 | HR 87 | Ht 64.0 in | Wt 127.0 lb

## 2017-03-19 DIAGNOSIS — R54 Age-related physical debility: Secondary | ICD-10-CM

## 2017-03-19 DIAGNOSIS — Z87891 Personal history of nicotine dependence: Secondary | ICD-10-CM

## 2017-03-19 DIAGNOSIS — J9611 Chronic respiratory failure with hypoxia: Secondary | ICD-10-CM

## 2017-03-19 DIAGNOSIS — R002 Palpitations: Secondary | ICD-10-CM | POA: Diagnosis not present

## 2017-03-19 DIAGNOSIS — Z8701 Personal history of pneumonia (recurrent): Secondary | ICD-10-CM

## 2017-03-19 DIAGNOSIS — Z902 Acquired absence of lung [part of]: Secondary | ICD-10-CM | POA: Diagnosis not present

## 2017-03-19 DIAGNOSIS — R531 Weakness: Secondary | ICD-10-CM

## 2017-03-19 DIAGNOSIS — R911 Solitary pulmonary nodule: Secondary | ICD-10-CM | POA: Diagnosis not present

## 2017-03-19 NOTE — Progress Notes (Signed)
PULMONARY CONSULT NOTE  Requesting MD/Service: [facility] hospitalist service Date of initial consultation: 03/19/17 Reason for consultation:S/P pneumonectomy, new LUL lung nodule  PT PROFILE: 84 y.o. female previously followed by Dr. Raul Del, remote former smoker, hospitalized 01/2017 for pneumonia.  CXR and CT scan revealed LUL nodule.  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 Irregular solid 1.4 cm anterior left upper lobe pulmonary nodule, increased in size from 0.7 cm on 10/2004 chest CT, suspicious for a slowly growing primary bronchogenic carcinoma. PET-CT is indicated for further characterization and can be performed as clinically warranted. New patchy peribronchovascular consolidation in the posterior left upper lobe and throughout the left lower lobe with associated scattered parenchymal banding, favor multilobar pneumonia superimposed on chronic postinfectious scarring. Recommend attention on follow-up post treatment chest CT in 3 months.   HPI:  Very pleasant 84 year old woman who underwent right pneumonectomy in 1979 for squamous cell carcinoma of the lung.  She is a former smoker of 1 PPD and quit in 1979 prior to her surgery.  She has been on chronic supplemental oxygen since 2006.  She was hospitalized 01/24/17-01/26/17 with increased dyspnea and subjective fevers.  Her chest x-ray was consistent with left lower lobe pneumonia.  She was also noted to have a left upper lobe nodule.  CT scan of the chest was performed with results as above.  She was previously seen by Dr. Raul Del but prefers a new pulmonologist and therefore is here for new consultation.  She reports that she has not recovered back to her previous baseline with persistent symptoms of weakness.  She denies cough and significant mucus production.  She has had no hemoptysis.  She does report nasal congestion and drainage.  She has chronic lower extremity edema which is not significantly  worse than her baseline.  There is no fever or chest pain.  Past Medical History:  Diagnosis Date  . COPD (chronic obstructive pulmonary disease) ([place])   . Glaucoma   . Restless legs     Past Surgical History:  Procedure Laterality Date  . APPENDECTOMY    . PNEUMONECTOMY Right    squamous cell carcinoma, all lobes on the right were removed.  . TONSILLECTOMY      MEDICATIONS: I have reviewed all medications and confirmed regimen as documented  Social History   Socioeconomic History  . Marital status: Married    Spouse name: Not on file  . Number of children: Not on file  . Years of education: Not on file  . Highest education level: Not on file  Social Needs  . Financial resource strain: Patient refused  . Food insecurity - worry: Patient refused  . Food insecurity - inability: Patient refused  . Transportation needs - medical: Patient refused  . Transportation needs - non-medical: Patient refused  Occupational History  . Not on file  Tobacco Use  . Smoking status: Former Smoker    Types: Cigarettes  . Smokeless tobacco: Never Used  Substance and Sexual Activity  . Alcohol use: Yes    Comment: occasionally  . Drug use: No  . Sexual activity: Not Currently  Other Topics Concern  . Not on file  Social History Narrative  . Not on file    Family History  Problem Relation Age of Onset  . Bladder Cancer Mother   . CAD Father     ROS: No fever, myalgias/arthralgias, unexplained weight loss or weight gain No new focal weakness or sensory deficits No otalgia, hearing loss, visual  changes, nasal and sinus symptoms, mouth and throat problems No neck pain or adenopathy No abdominal pain, N/V/D, diarrhea, change in bowel pattern No dysuria, change in urinary pattern   Vitals:   03/19/17 1349 03/19/17 1403  BP:  122/76  Pulse:  87  SpO2:  92%  Weight: 57.6 kg (127 lb)   Height: 5\' 4"  (1.626 m)      EXAM:  Gen: Cognition intact, frail appearing, NAD at rest,  in WC HEENT: NCAT, sclera white, oropharynx normal Neck: Supple without LAN, thyromegaly, JVD Lungs: breath sounds absent on right and diminished on the left without adventitious sounds Cardiovascular: Irregular with frequent extrasystoles, no murmurs noted Abdomen: Soft, nontender, normal BS Ext: Symmetric ankle and pedal edema Neuro: CNs grossly intact, motor and sensory intact Skin: Limited exam, no lesions noted  DATA:   BMP Latest Ref Rng & Units 01/25/2017 01/24/2017 04/01/2016  Glucose 65 - 99 mg/dL 148(H) 138(H) 121(H)  BUN 6 - 20 mg/dL 19 11 20   Creatinine 0.44 - 1.00 mg/dL 0.56 0.56 0.44  Sodium 135 - 145 mmol/L 136 138 143  Potassium 3.5 - 5.1 mmol/L 4.1 3.9 3.4(L)  Chloride 101 - 111 mmol/L 97(L) 94(L) 95(L)  CO2 22 - 32 mmol/L 28 32 38(H)  Calcium 8.9 - 10.3 mg/dL 8.6(L) 9.1 8.9    CBC Latest Ref Rng & Units 01/25/2017 01/24/2017 04/01/2016  WBC 3.6 - 11.0 K/uL 10.9 13.2(H) 11.2(H)  Hemoglobin 12.0 - 16.0 g/dL 14.8 17.0(H) 15.2  Hematocrit 35.0 - 47.0 % 44.1 51.2(H) 44.6  Platelets 150 - 440 K/uL 156 167 206    CXR 01/24/17: Status post right pneumonectomy.  Ill-defined LUL opacity/nodule.  Irregular left basilar opacity EKG 01/24/17: NSR with PACs  IMPRESSION:     ICD-10-CM   1. Palpitations R00.2 EKG 12-Lead  2. Chronic hypoxemic respiratory failure (HCC) J96.11   3. Status post pneumonectomy Z90.2   4. Recent pneumonia Z87.01   5. Former smoker Z87.891   37. Lung nodule -appearance is worrisome for bronchogenic carcinoma R91.1 DG Chest 2 View  7. Advanced age R14   8. Weakness R53.1    With regard to the left upper lobe lung nodule, I have explained my concern that it represents a malignancy based on its radiographic appearance.  She understands this and exhibits good insight in that, if it is malignancy, there would be limited options for treatment due to her advanced age and severe pulmonary limitation.  Therefore, she wishes to take a less aggressive  approach.  Although it is not available to me, the current report of the CT chest cites a previous CT chest from 2006 which indicates that there was a left upper lobe nodule present at that time.  However, the current findings suggest the nodule has doubled in diameter since 2006.  PLAN:  1) EKG performed today.  This revealed frequent PACs but no significant underlying arrhythmia 2) continue Spiriva inhaler 3) continue supplemental oxygen as close to 24 hours/day as possible 4) trial of Breo inhaler with instruction to use it 1 week on, one-week off etc. to discern whether it offers any additional benefit 5) follow-up in 4 weeks with repeat CXR at that time     Merton Border, MD PCCM service Mobile [phone] Pager [phone] 03/19/2017 2:54 PM

## 2017-03-19 NOTE — Patient Instructions (Signed)
EKG ordered for today Continue Spiriva inhaler Trial of Breo inhaler -you may use 1 week on, one-week off etc. to discern whether it is of benefit Continue supplemental oxygen as close to 24 hours/day as possible Follow-up in 4 weeks with chest x-ray prior to that visit

## 2017-04-11 ENCOUNTER — Ambulatory Visit (INDEPENDENT_AMBULATORY_CARE_PROVIDER_SITE_OTHER): Payer: Medicare Other | Admitting: Pulmonary Disease

## 2017-04-11 ENCOUNTER — Ambulatory Visit
Admission: RE | Admit: 2017-04-11 | Discharge: 2017-04-11 | Disposition: A | Discharge status: Home / Self Care | Payer: Medicare Other | Source: Ambulatory Visit | Attending: Pulmonary Disease | Admitting: Pulmonary Disease

## 2017-04-11 ENCOUNTER — Encounter: Payer: Self-pay | Admitting: Pulmonary Disease

## 2017-04-11 VITALS — BP 108/68 | HR 72 | Ht 64.0 in | Wt 123.0 lb

## 2017-04-11 DIAGNOSIS — J449 Chronic obstructive pulmonary disease, unspecified: Secondary | ICD-10-CM

## 2017-04-11 DIAGNOSIS — J9611 Chronic respiratory failure with hypoxia: Secondary | ICD-10-CM

## 2017-04-11 DIAGNOSIS — R911 Solitary pulmonary nodule: Secondary | ICD-10-CM | POA: Diagnosis not present

## 2017-04-11 DIAGNOSIS — R54 Age-related physical debility: Secondary | ICD-10-CM

## 2017-04-11 DIAGNOSIS — Z902 Acquired absence of lung [part of]: Secondary | ICD-10-CM | POA: Diagnosis not present

## 2017-04-11 DIAGNOSIS — Z9889 Other specified postprocedural states: Secondary | ICD-10-CM | POA: Diagnosis not present

## 2017-04-11 DIAGNOSIS — I700 Atherosclerosis of aorta: Secondary | ICD-10-CM | POA: Insufficient documentation

## 2017-04-11 DIAGNOSIS — R918 Other nonspecific abnormal finding of lung field: Secondary | ICD-10-CM | POA: Diagnosis not present

## 2017-04-11 MED ORDER — FLUTICASONE FUROATE-VILANTEROL 100-25 MCG/INH IN AEPB: 1.0000 | INHALATION_SPRAY | Freq: Every day | RESPIRATORY_TRACT | 5 refills | Status: DC

## 2017-04-11 NOTE — Patient Instructions (Addendum)
Continue Breo inhaler -prescription has been entered Continue Spiriva Continue supplemental oxygen CT scan of chest in 2-3 weeks with follow-up in this office after that

## 2017-04-18 NOTE — Progress Notes (Signed)
PULMONARY OFFICE FOLLOW-UP NOTE  Requesting MD/Service: [facility] hospitalist service Date of initial consultation: 03/19/17 Reason for consultation:S/P pneumonectomy, new LUL lung nodule  PT PROFILE: 84 y.o. female previously followed by Dr. Raul Del, remote former smoker, history of R pneumonectomy in 1980s for lung cancer, hospitalized 01/2017 for pneumonia.  CXR and CT scan revealed LUL nodule.  DATA: CT chest 01/24/17: Status post R pneumonectomy. Irregular solid 1.4 cm anterior left upper lobe pulmonary nodule, increased in size from 0.7 cm on 10/2004 chest CT, suspicious for a slowly growing primary bronchogenic carcinoma.     SUBJ:  This is a routine reevaluation.  Last visit, we initiated Breo inhaler.  She states that he has benefited significantly from this.  She describes her response to it as "great".  He has an albuterol inhaler which she is not using at all. Denies CP, fever, purulent sputum, hemoptysis, LE edema and calf tenderness.   Vitals:   04/11/17 1100 04/11/17 1106  BP:  108/68  Pulse:  72  SpO2:  95%  Weight: 55.8 kg (123 lb)   Height: 5\' 4"  (1.626 m)   Room air   EXAM:  Gen: Frail appearing, NAD HEENT: WNL Neck: No LAN, JVD noted Lungs: breath sounds absent on R, initiate on L without adventitious sounds Cardiovascular: Frequent extrasystoles, no M Abdomen: Soft, nontender, normal BS Ext: Symmetric ankle and pedal edema Neuro: No focal deficits noted on limited exam  DATA:   BMP Latest Ref Rng & Units 01/25/2017 01/24/2017 04/01/2016  Glucose 65 - 99 mg/dL 148(H) 138(H) 121(H)  BUN 6 - 20 mg/dL 19 11 20   Creatinine 0.44 - 1.00 mg/dL 0.56 0.56 0.44  Sodium 135 - 145 mmol/L 136 138 143  Potassium 3.5 - 5.1 mmol/L 4.1 3.9 3.4(L)  Chloride 101 - 111 mmol/L 97(L) 94(L) 95(L)  CO2 22 - 32 mmol/L 28 32 38(H)  Calcium 8.9 - 10.3 mg/dL 8.6(L) 9.1 8.9    CBC Latest Ref Rng & Units 01/25/2017 01/24/2017 04/01/2016  WBC 3.6 - 11.0 K/uL 10.9 13.2(H) 11.2(H)   Hemoglobin 12.0 - 16.0 g/dL 14.8 17.0(H) 15.2  Hematocrit 35.0 - 47.0 % 44.1 51.2(H) 44.6  Platelets 150 - 440 K/uL 156 167 206    CXR 04/11/17: Status post right pneumonectomy.  Ill-defined LUL opacity/nodule without significant change since previous exam 01/24/17.  Irregular left basilar opacity without change  IMPRESSION:     ICD-10-CM   1. History of pneumonectomy Z98.890    Z90.2   2. Lung nodule R91.1   3. Chronic hypoxemic respiratory failure (HCC) J96.11   4. COPD -favorable response to Breo inhaler J44.9   5. Solitary pulmonary nodule R91.1 CT CHEST WO CONTRAST  6. Advanced age R40    LUL nodule worrisome for slowly progressive malignancy. It is not likely to be treatable and she acknowledges that, if malignant, there would be little to be gained by taking an aggressive approach given her advanced age and rather frail state  PLAN:  Continue Breo inhaler -prescription has been entered Continue Spiriva Continue supplemental oxygen CT scan of chest in 2-3 weeks with follow-up in this office after that.   Merton Border, MD PCCM service Mobile [phone] Pager [phone] 04/18/2017 5:13 PM

## 2017-04-28 ENCOUNTER — Ambulatory Visit: Payer: Self-pay | Admitting: Internal Medicine

## 2017-05-05 ENCOUNTER — Ambulatory Visit
Admission: RE | Admit: 2017-05-05 | Discharge: 2017-05-05 | Disposition: A | Discharge status: Home / Self Care | Payer: Medicare Other | Source: Ambulatory Visit | Attending: Pulmonary Disease | Admitting: Pulmonary Disease

## 2017-05-05 DIAGNOSIS — Z902 Acquired absence of lung [part of]: Secondary | ICD-10-CM | POA: Diagnosis not present

## 2017-05-05 DIAGNOSIS — I517 Cardiomegaly: Secondary | ICD-10-CM | POA: Diagnosis not present

## 2017-05-05 DIAGNOSIS — J439 Emphysema, unspecified: Secondary | ICD-10-CM | POA: Diagnosis not present

## 2017-05-05 DIAGNOSIS — R590 Localized enlarged lymph nodes: Secondary | ICD-10-CM | POA: Insufficient documentation

## 2017-05-05 DIAGNOSIS — I700 Atherosclerosis of aorta: Secondary | ICD-10-CM | POA: Diagnosis not present

## 2017-05-05 DIAGNOSIS — R911 Solitary pulmonary nodule: Secondary | ICD-10-CM | POA: Diagnosis not present

## 2017-05-05 DIAGNOSIS — I2510 Atherosclerotic heart disease of native coronary artery without angina pectoris: Secondary | ICD-10-CM | POA: Diagnosis not present

## 2017-05-13 ENCOUNTER — Encounter: Payer: Self-pay | Admitting: Pulmonary Disease

## 2017-05-13 ENCOUNTER — Telehealth: Payer: Self-pay | Admitting: Pulmonary Disease

## 2017-05-13 ENCOUNTER — Ambulatory Visit (INDEPENDENT_AMBULATORY_CARE_PROVIDER_SITE_OTHER): Payer: Medicare Other | Admitting: Pulmonary Disease

## 2017-05-13 VITALS — BP 130/86 | HR 97 | Ht 64.0 in | Wt 124.0 lb

## 2017-05-13 DIAGNOSIS — Z902 Acquired absence of lung [part of]: Secondary | ICD-10-CM

## 2017-05-13 DIAGNOSIS — J9611 Chronic respiratory failure with hypoxia: Secondary | ICD-10-CM | POA: Diagnosis not present

## 2017-05-13 DIAGNOSIS — J449 Chronic obstructive pulmonary disease, unspecified: Secondary | ICD-10-CM

## 2017-05-13 DIAGNOSIS — R911 Solitary pulmonary nodule: Secondary | ICD-10-CM | POA: Diagnosis not present

## 2017-05-13 DIAGNOSIS — Z9889 Other specified postprocedural states: Secondary | ICD-10-CM | POA: Diagnosis not present

## 2017-05-13 NOTE — Telephone Encounter (Signed)
Pt informed that we didn't refill any meds today and that when she needs refills on inhalers to give [place] a call and let us know which pharmacy she would like them to go to. Pt verbalized understanding. Nothing further needed.

## 2017-05-13 NOTE — Progress Notes (Signed)
PULMONARY OFFICE FOLLOW-UP NOTE  Requesting MD/Service: [facility] hospitalist service Date of initial consultation: 03/19/17 Reason for consultation:S/P pneumonectomy, new LUL lung nodule  PT PROFILE: 84 y.o. female previously followed by Dr. Raul Del, remote former smoker, history of R pneumonectomy in 1980s for lung cancer, hospitalized 01/2017 for pneumonia.  CXR and CT scan revealed LUL nodule.  DATA: CT chest 01/24/17: Status post R pneumonectomy. Irregular solid 1.4 cm anterior left upper lobe pulmonary nodule, increased in size from 0.7 cm on 10/2004 chest CT, suspicious for a slowly growing primary bronchogenic carcinoma.   CT chest 05/05/17: Stable spiculated anterior left upper lobe pulmonary nodule. The appearance and change since 2006 remains suspicious for a slowly growing primary bronchogenic carcinoma   SUBJ:  This is a routine reevaluation.  2 visits ago, we initiated Breo inhaler which she believes has been of great benefit.  Last visit, I ordered a CT scan of the chest for evaluation of left upper lobe nodule.  She is here to review that study.  She has no new complaints.  She denies CP, fever, purulent sputum, hemoptysis, LE edema and calf tenderness.   Vitals:   05/13/17 1010 05/13/17 1013  BP:  130/86  Pulse:  97  SpO2:  94%  Weight: 56.2 kg (124 lb)   Height: 5\' 4"  (1.626 m)   2 LPM [place]   EXAM:  Gen: Frail appearing, NAD HEENT: WNL Neck: No LAN, JVD noted Lungs: breath sounds absent on R, no wheezes or other adventitious sounds Cardiovascular: RRR, no M Abdomen: Soft, nontender, normal BS Ext: 1+ symmetric ankle and pedal edema Neuro: No focal deficits noted on limited exam  DATA:   BMP Latest Ref Rng & Units 01/25/2017 01/24/2017 04/01/2016  Glucose 65 - 99 mg/dL 148(H) 138(H) 121(H)  BUN 6 - 20 mg/dL 19 11 20   Creatinine 0.44 - 1.00 mg/dL 0.56 0.56 0.44  Sodium 135 - 145 mmol/L 136 138 143  Potassium 3.5 - 5.1 mmol/L 4.1 3.9 3.4(L)  Chloride 101 - 111  mmol/L 97(L) 94(L) 95(L)  CO2 22 - 32 mmol/L 28 32 38(H)  Calcium 8.9 - 10.3 mg/dL 8.6(L) 9.1 8.9    CBC Latest Ref Rng & Units 01/25/2017 01/24/2017 04/01/2016  WBC 3.6 - 11.0 K/uL 10.9 13.2(H) 11.2(H)  Hemoglobin 12.0 - 16.0 g/dL 14.8 17.0(H) 15.2  Hematocrit 35.0 - 47.0 % 44.1 51.2(H) 44.6  Platelets 150 - 440 K/uL 156 167 206    CXR: No new film  IMPRESSION:     ICD-10-CM   1. History of pneumonectomy Z98.890    Z90.2   2. Lung nodule R91.1   3. Chronic obstructive pulmonary disease, unspecified COPD type ([place]) J44.9   4. Chronic hypoxemic respiratory failure (HCC) J96.11    LUL nodule likely represents a very slow-growing malignancy.  Together in the exam room today.  We agreed that it warrants no further evaluation at this time given the limited benefit of confirming a diagnosis of lung cancer since she is of advanced age and has already undergone a pneumonectomy  PLAN:  Continue Breo and Spiriva inhaler  Continue albuterol inhaler as needed Continue supplemental oxygen 24/7 Follow-up in 3-4 months We will plan on obtaining a repeat CT scan of the chest in approximately 6 months.  This will be discussed at next visit.  Merton Border, MD PCCM service Mobile [phone] Pager [phone] 05/13/2017 4:53 PM

## 2017-05-13 NOTE — Telephone Encounter (Signed)
Per patient pharmacy changed to [facility] on [address] please change and forward any meds

## 2017-05-13 NOTE — Patient Instructions (Signed)
Continue oxygen therapy is close to 24 hours/day as possible Continue Breo inhaler Continue albuterol inhaler as needed Follow-up in 3-4 months We will plan on obtaining a repeat CT scan of the chest in approximately 6 months

## 2017-06-23 DIAGNOSIS — H401131 Primary open-angle glaucoma, bilateral, mild stage: Secondary | ICD-10-CM | POA: Diagnosis not present

## 2017-06-30 DIAGNOSIS — R600 Localized edema: Secondary | ICD-10-CM | POA: Diagnosis not present

## 2017-06-30 DIAGNOSIS — J9611 Chronic respiratory failure with hypoxia: Secondary | ICD-10-CM | POA: Diagnosis not present

## 2017-06-30 DIAGNOSIS — J432 Centrilobular emphysema: Secondary | ICD-10-CM | POA: Diagnosis not present

## 2017-06-30 DIAGNOSIS — G2581 Restless legs syndrome: Secondary | ICD-10-CM | POA: Diagnosis not present

## 2017-07-03 ENCOUNTER — Ambulatory Visit (INDEPENDENT_AMBULATORY_CARE_PROVIDER_SITE_OTHER): Payer: Medicare Other | Admitting: Pulmonary Disease

## 2017-07-03 ENCOUNTER — Encounter: Payer: Self-pay | Admitting: Pulmonary Disease

## 2017-07-03 DIAGNOSIS — Z902 Acquired absence of lung [part of]: Secondary | ICD-10-CM

## 2017-07-03 DIAGNOSIS — R918 Other nonspecific abnormal finding of lung field: Secondary | ICD-10-CM | POA: Diagnosis not present

## 2017-07-03 DIAGNOSIS — Z9889 Other specified postprocedural states: Secondary | ICD-10-CM | POA: Diagnosis not present

## 2017-07-03 DIAGNOSIS — J9611 Chronic respiratory failure with hypoxia: Secondary | ICD-10-CM | POA: Diagnosis not present

## 2017-07-03 DIAGNOSIS — Z87891 Personal history of nicotine dependence: Secondary | ICD-10-CM

## 2017-07-03 DIAGNOSIS — J441 Chronic obstructive pulmonary disease with (acute) exacerbation: Secondary | ICD-10-CM | POA: Diagnosis not present

## 2017-07-03 MED ORDER — LEVOFLOXACIN 250 MG PO TABS: 250.0000 mg | ORAL_TABLET | Freq: Every day | ORAL | 0 refills | Status: AC

## 2017-07-03 MED ORDER — ALBUTEROL SULFATE HFA 108 (90 BASE) MCG/ACT IN AERS: 1.0000 | INHALATION_SPRAY | Freq: Four times a day (QID) | RESPIRATORY_TRACT | 10 refills | Status: AC | PRN

## 2017-07-03 NOTE — Patient Instructions (Signed)
Levaquin (levofloxacin) 250 mg daily for 7 days Continue Breo and Spiriva inhalers I have placed an order for albuterol rescue inhaler to be used as needed for shortness of breath or wheezing Follow-up in 3-4 weeks with a chest x-ray prior to that visit

## 2017-07-03 NOTE — Progress Notes (Signed)
PULMONARY OFFICE FOLLOW-UP NOTE  Requesting MD/Service: [facility] hospitalist service Date of initial consultation: 03/19/17 Reason for consultation:S/P pneumonectomy, new LUL lung nodule  PT PROFILE: 84 y.o. female previously followed by Dr. Raul Del, remote former smoker, history of R pneumonectomy in 1980s for lung cancer, hospitalized 01/2017 for pneumonia.  CXR and CT scan revealed LUL nodule.  DATA: CT chest 01/24/17: Status post R pneumonectomy. Irregular solid 1.4 cm anterior left upper lobe pulmonary nodule, increased in size from 0.7 cm on 10/2004 chest CT, suspicious for a slowly growing primary bronchogenic carcinoma.   CT chest 05/05/17: Stable spiculated anterior left upper lobe pulmonary nodule. The appearance and change since 2006 remains suspicious for a slowly growing primary bronchogenic carcinoma  INTERVAL: Last seen 05/13/17. No major events until past few days as described below   SUBJ:  This is an early reevaluation. Pt wished to be seen early due to development of increased dyspnea, chest tightness, dry cough.  She denies hemoptysis, fever, chest pain, LE edema, calf tenderness.  She remains on Breo and Spiriva inhalers.  He does not has a rescue inhaler.  In the past, for similar illnesses, she has used levofloxacin with significant benefit.  Vitals:   07/03/17 1042 07/03/17 1046  BP:  (!) 98/58  Pulse:  97  SpO2:  94%  Weight: 120 lb (54.4 kg)   Height: 5\' 4"  (1.626 m)   2 LPM [place]   EXAM:  Gen: Frail, NAD HEENT: No acute findings Neck: No LAN, no JVD Lungs: Breath sounds essentially absent on the right, no wheezes or adventitious sounds Cardiovascular: Regular, no M Abdomen: Soft, NT, NABS Ext: No edema Neuro: No focal findings  DATA:   BMP Latest Ref Rng & Units 01/25/2017 01/24/2017 04/01/2016  Glucose 65 - 99 mg/dL 148(H) 138(H) 121(H)  BUN 6 - 20 mg/dL 19 11 20   Creatinine 0.44 - 1.00 mg/dL 0.56 0.56 0.44  Sodium 135 - 145 mmol/L 136 138 143   Potassium 3.5 - 5.1 mmol/L 4.1 3.9 3.4(L)  Chloride 101 - 111 mmol/L 97(L) 94(L) 95(L)  CO2 22 - 32 mmol/L 28 32 38(H)  Calcium 8.9 - 10.3 mg/dL 8.6(L) 9.1 8.9    CBC Latest Ref Rng & Units 01/25/2017 01/24/2017 04/01/2016  WBC 3.6 - 11.0 K/uL 10.9 13.2(H) 11.2(H)  Hemoglobin 12.0 - 16.0 g/dL 14.8 17.0(H) 15.2  Hematocrit 35.0 - 47.0 % 44.1 51.2(H) 44.6  Platelets 150 - 440 K/uL 156 167 206    CXR: No new film  IMPRESSION:     ICD-10-CM   1. History of R pneumonectomy (remote) Z98.890 DG Chest 2 View   Z90.2   2. Former smoker Z87.891   3. Chronic hypoxemic respiratory failure (HCC) J96.11   4. COPD with acute exacerbation ([place]) J44.1   5. LUL opacity - likely malignant. Appears to be slow growing R91.8 DG Chest 2 View    PLAN:  Continue Breo and Spiriva inhaler  Levofloxacin 250 daily X 7 I have placed order for albuterol rescue MDI Continue supplemental oxygen 24/7 Follow-up in 3-4 weeks with CXR prior to that visit to re-assess LUL opacity We plan on obtaining a repeat CT scan of the chest in August of this year  Merton Border, MD PCCM service Mobile [phone] Pager [phone] 07/03/2017 11:14 AM

## 2017-07-07 DIAGNOSIS — R600 Localized edema: Secondary | ICD-10-CM | POA: Diagnosis not present

## 2017-07-07 DIAGNOSIS — G2581 Restless legs syndrome: Secondary | ICD-10-CM | POA: Diagnosis not present

## 2017-07-07 DIAGNOSIS — J432 Centrilobular emphysema: Secondary | ICD-10-CM | POA: Diagnosis not present

## 2017-07-07 DIAGNOSIS — J9611 Chronic respiratory failure with hypoxia: Secondary | ICD-10-CM | POA: Diagnosis not present

## 2017-07-14 ENCOUNTER — Ambulatory Visit: Payer: Medicare Other | Admitting: Pulmonary Disease

## 2017-08-04 ENCOUNTER — Ambulatory Visit (INDEPENDENT_AMBULATORY_CARE_PROVIDER_SITE_OTHER): Payer: Medicare Other | Admitting: Pulmonary Disease

## 2017-08-04 ENCOUNTER — Encounter: Payer: Self-pay | Admitting: Pulmonary Disease

## 2017-08-04 ENCOUNTER — Ambulatory Visit
Admission: RE | Admit: 2017-08-04 | Discharge: 2017-08-04 | Disposition: A | Discharge status: Home / Self Care | Payer: Medicare Other | Source: Ambulatory Visit | Attending: Pulmonary Disease | Admitting: Pulmonary Disease

## 2017-08-04 VITALS — BP 108/68 | HR 69 | Ht 64.0 in | Wt 118.0 lb

## 2017-08-04 DIAGNOSIS — Z902 Acquired absence of lung [part of]: Secondary | ICD-10-CM | POA: Insufficient documentation

## 2017-08-04 DIAGNOSIS — J9611 Chronic respiratory failure with hypoxia: Secondary | ICD-10-CM

## 2017-08-04 DIAGNOSIS — R918 Other nonspecific abnormal finding of lung field: Secondary | ICD-10-CM | POA: Insufficient documentation

## 2017-08-04 DIAGNOSIS — R911 Solitary pulmonary nodule: Secondary | ICD-10-CM

## 2017-08-04 DIAGNOSIS — Z9889 Other specified postprocedural states: Secondary | ICD-10-CM | POA: Insufficient documentation

## 2017-08-04 DIAGNOSIS — Z87891 Personal history of nicotine dependence: Secondary | ICD-10-CM

## 2017-08-04 DIAGNOSIS — J449 Chronic obstructive pulmonary disease, unspecified: Secondary | ICD-10-CM | POA: Diagnosis not present

## 2017-08-04 NOTE — Progress Notes (Signed)
PULMONARY OFFICE FOLLOW-UP NOTE  Requesting MD/Service: [facility] hospitalist service Date of initial consultation: 03/19/17 Reason for consultation:S/P pneumonectomy, new LUL lung nodule  PT PROFILE: 84 y.o. female previously followed by Dr. Raul Del, remote former smoker, history of R pneumonectomy in 1980s for lung cancer, hospitalized 01/2017 for pneumonia.  CXR and CT scan revealed LUL nodule.  DATA: CT chest 01/24/17: Status post R pneumonectomy. Irregular solid 1.4 cm anterior left upper lobe pulmonary nodule, increased in size from 0.7 cm on 10/2004 chest CT, suspicious for a slowly growing primary bronchogenic carcinoma.   CT chest 05/05/17: Stable spiculated anterior left upper lobe pulmonary nodule. The appearance and change since 2006 remains suspicious for a slowly growing primary bronchogenic carcinoma  INTERVAL: Last seen 07/03/17. Treated with levofloxacin for COPD exacerbation.   SUBJ:  Routine re-eval. Completed course of levofloxacin prescribed last visit. Feels that she is essentially back to her baseline. No new complaints. Denies CP, fever, purulent sputum, hemoptysis, LE edema and calf tenderness.  Vitals:   08/04/17 1104 08/04/17 1108  BP:  108/68  Pulse:  69  SpO2:  91%  Weight: 118 lb (53.5 kg)   Height: 5\' 4"  (1.626 m)   2 LPM [place]   EXAM:  Gen: No overt respiratory distress HEENT: NCAT, sclerae white Neck: No lymphadenopathy, no JVD Lungs: No wheezes or adventitious sounds Cardiovascular: Regular, no M Abdomen: Soft, NABS Ext: No C/C/E Neuro: No focal deficits  DATA:   BMP Latest Ref Rng & Units 01/25/2017 01/24/2017 04/01/2016  Glucose 65 - 99 mg/dL 148(H) 138(H) 121(H)  BUN 6 - 20 mg/dL 19 11 20   Creatinine 0.44 - 1.00 mg/dL 0.56 0.56 0.44  Sodium 135 - 145 mmol/L 136 138 143  Potassium 3.5 - 5.1 mmol/L 4.1 3.9 3.4(L)  Chloride 101 - 111 mmol/L 97(L) 94(L) 95(L)  CO2 22 - 32 mmol/L 28 32 38(H)  Calcium 8.9 - 10.3 mg/dL 8.6(L) 9.1 8.9    CBC  Latest Ref Rng & Units 01/25/2017 01/24/2017 04/01/2016  WBC 3.6 - 11.0 K/uL 10.9 13.2(H) 11.2(H)  Hemoglobin 12.0 - 16.0 g/dL 14.8 17.0(H) 15.2  Hematocrit 35.0 - 47.0 % 44.1 51.2(H) 44.6  Platelets 150 - 440 K/uL 156 167 206    CXR 5/20: Status post right pneumonectomy.  Left apical nodule unchanged compared to previous film.  IMPRESSION:     ICD-10-CM   1. S/P pneumonectomy Z90.2   2. Former smoker Z87.891   3. Chronic hypoxemic respiratory failure (HCC) J96.11   4. COPD J44.9   5. Left upper lobe nodule-likely slow-growing malignancy R91.1    We again discussed the presence of the LUL nodule and my impression that it is likely malignant (based on prior CT scan appearance).  We again agreed that there was limited benefit to pursuing an evaluation of this given her advanced age and severe baseline respiratory impairment  PLAN:  Continue Breo inhaler Continue Spiriva inhaler Continue albuterol rescue inhaler as needed for increased shortness of breath, cough, chest tightness, wheezing Continue oxygen therapy is close to 24 hours/day as possible Follow-up in 3 months or sooner as needed  Merton Border, MD PCCM service Mobile [phone] Pager [phone] 08/06/2017 4:36 PM

## 2017-08-04 NOTE — Patient Instructions (Signed)
Continue Breo inhaler Continue Spiriva inhaler Continue albuterol rescue inhaler as needed for increased shortness of breath, cough, chest tightness, wheezing Continue oxygen therapy is close to 24 hours/day as possible Follow-up in 3 months or sooner as needed

## 2017-08-21 ENCOUNTER — Ambulatory Visit: Payer: Medicare Other | Admitting: Pulmonary Disease

## 2017-09-29 ENCOUNTER — Other Ambulatory Visit: Payer: Self-pay | Admitting: Pulmonary Disease

## 2017-11-03 ENCOUNTER — Ambulatory Visit (INDEPENDENT_AMBULATORY_CARE_PROVIDER_SITE_OTHER): Payer: Medicare Other | Admitting: Pulmonary Disease

## 2017-11-03 ENCOUNTER — Encounter: Payer: Self-pay | Admitting: Pulmonary Disease

## 2017-11-03 VITALS — BP 112/60 | HR 106 | Ht 64.0 in | Wt 115.0 lb

## 2017-11-03 DIAGNOSIS — R911 Solitary pulmonary nodule: Secondary | ICD-10-CM | POA: Diagnosis not present

## 2017-11-03 DIAGNOSIS — Z902 Acquired absence of lung [part of]: Secondary | ICD-10-CM

## 2017-11-03 DIAGNOSIS — Z87891 Personal history of nicotine dependence: Secondary | ICD-10-CM

## 2017-11-03 DIAGNOSIS — J9611 Chronic respiratory failure with hypoxia: Secondary | ICD-10-CM | POA: Diagnosis not present

## 2017-11-03 NOTE — Progress Notes (Signed)
PULMONARY OFFICE FOLLOW-UP NOTE  Requesting MD/Service: [facility] hospitalist service Date of initial consultation: 03/19/17 Reason for consultation:S/P pneumonectomy, new LUL lung nodule  PT PROFILE: 84 y.o. female previously followed by Dr. Raul Del, remote former smoker, history of R pneumonectomy in 1980s for lung cancer, hospitalized 01/2017 for pneumonia.  CXR and CT scan revealed LUL nodule.  DATA: CT chest 01/24/17: Status post R pneumonectomy. Irregular solid 1.4 cm anterior left upper lobe pulmonary nodule, increased in size from 0.7 cm on 10/2004 chest CT, suspicious for a slowly growing primary bronchogenic carcinoma.   CT chest 05/05/17: Stable spiculated anterior left upper lobe pulmonary nodule. The appearance and change since 2006 remains suspicious for a slowly growing primary bronchogenic carcinoma  INTERVAL: Last seen 08/04/2017.  No major pulmonary events  SUBJ:  This is a scheduled ROV.  She has no new complaints.  She remains markedly limited by exertional dyspnea.  She remains on Breo and Spiriva.  She rarely uses albuterol rescue inhaler.  She is wearing oxygen 24 hours/day.  She denies CP, fever, purulent sputum, hemoptysis, LE edema and calf tenderness.  Vitals:   11/03/17 0941 11/03/17 0946  BP:  112/60  Pulse:  (!) 106  SpO2:  90%  Weight: 115 lb (52.2 kg)   Height: 5\' 4"  (1.626 m)   2 LPM [place] (pulse)   EXAM:  Gen: Somewhat frail-appearing, NAD HEENT: NCAT, sclerae white Neck: No JVD noted Lungs: Diminished breath sounds without adventitious sounds Cardiovascular: Regular, no M Abdomen: Soft, NABS Ext: No C/C/E Neuro: No focal deficits  DATA:   BMP Latest Ref Rng & Units 01/25/2017 01/24/2017 04/01/2016  Glucose 65 - 99 mg/dL 148(H) 138(H) 121(H)  BUN 6 - 20 mg/dL 19 11 20   Creatinine 0.44 - 1.00 mg/dL 0.56 0.56 0.44  Sodium 135 - 145 mmol/L 136 138 143  Potassium 3.5 - 5.1 mmol/L 4.1 3.9 3.4(L)  Chloride 101 - 111 mmol/L 97(L) 94(L) 95(L)  CO2 22 -  32 mmol/L 28 32 38(H)  Calcium 8.9 - 10.3 mg/dL 8.6(L) 9.1 8.9    CBC Latest Ref Rng & Units 01/25/2017 01/24/2017 04/01/2016  WBC 3.6 - 11.0 K/uL 10.9 13.2(H) 11.2(H)  Hemoglobin 12.0 - 16.0 g/dL 14.8 17.0(H) 15.2  Hematocrit 35.0 - 47.0 % 44.1 51.2(H) 44.6  Platelets 150 - 440 K/uL 156 167 206    CXR: No new film  IMPRESSION:     ICD-10-CM   1. Former smoker Z87.891   2. S/P pneumonectomy Z90.2   3. Chronic hypoxemic respiratory failure (HCC) J96.11   4. Left upper lobe nodule - likely slow-growing malignancy R91.1 DG Chest 2 View   Once again, we discussed the LUL nodule and my impression that it is likely malignant (based on prior CT scan appearance).  She reiterates that she does not wish to undergo any further evaluation of such.  We discussed goals of care.  I suggested that she (and her husband) might benefit from palliative care services.  They will discuss this and inform us if they wish to initiate such services.  PLAN:  Continue Breo inhaler Continue Spiriva inhaler Continue albuterol rescue inhaler as needed  Continue oxygen therapy is close to 24 hours/day as possible We will consult palliative care services if they desire Follow-up in 3 months with chest x-ray prior to that visit  Merton Border, MD PCCM service Mobile [phone] Pager [phone] 11/03/2017 3:48 PM

## 2017-11-03 NOTE — Patient Instructions (Signed)
Continue Breo inhaler Continue Spiriva inhaler Continue albuterol as needed Continue oxygen therapy as close to 24 hours/day as possible We discussed enlisting palliative care services Follow-up in 3 months with chest x-ray prior to that visit

## 2017-11-06 ENCOUNTER — Telehealth: Payer: Self-pay | Admitting: Pulmonary Disease

## 2017-11-06 NOTE — Telephone Encounter (Signed)
Patient calling in regards to visit on 8/19 Patient would like to start the process for palliative care Please call to discusss

## 2017-11-06 NOTE — Telephone Encounter (Signed)
Pt has decided to do Palliative Care. Please sign the form I placed in your folder so that I can get it sent in. Thanks

## 2017-11-07 ENCOUNTER — Other Ambulatory Visit: Payer: Self-pay | Admitting: *Deleted

## 2017-11-07 DIAGNOSIS — J9611 Chronic respiratory failure with hypoxia: Secondary | ICD-10-CM

## 2017-11-30 IMAGING — CR DG CHEST 2V
1 series · 2 of 2 positions shown · non-contrast
Comparison: March 28, 2016

CLINICAL DATA: Shortness of Breath

EXAM:
CHEST  2 VIEW

[Series 1: dg chest 2 view · 0.14mm/px · 2 of 2 slices shown]
[im 1/2]
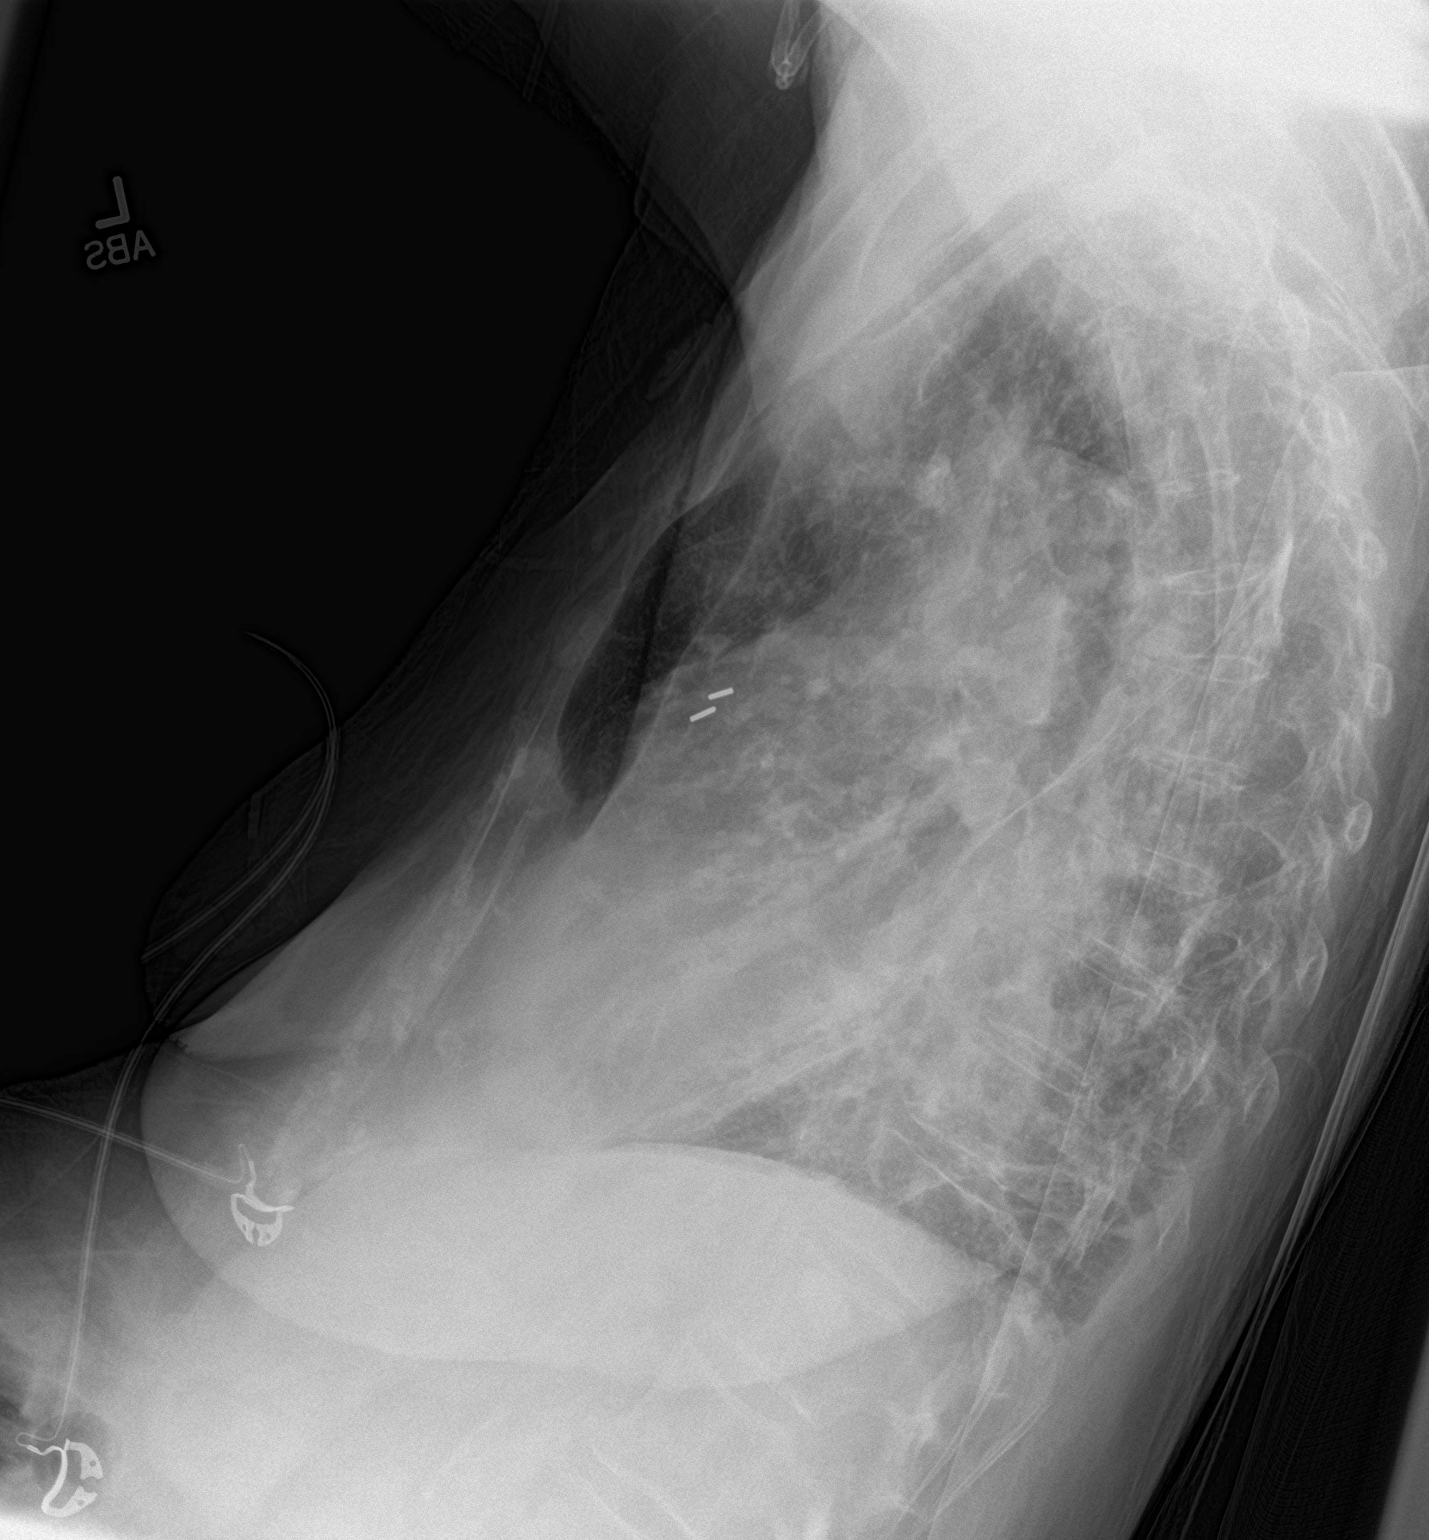
[im 2/2]
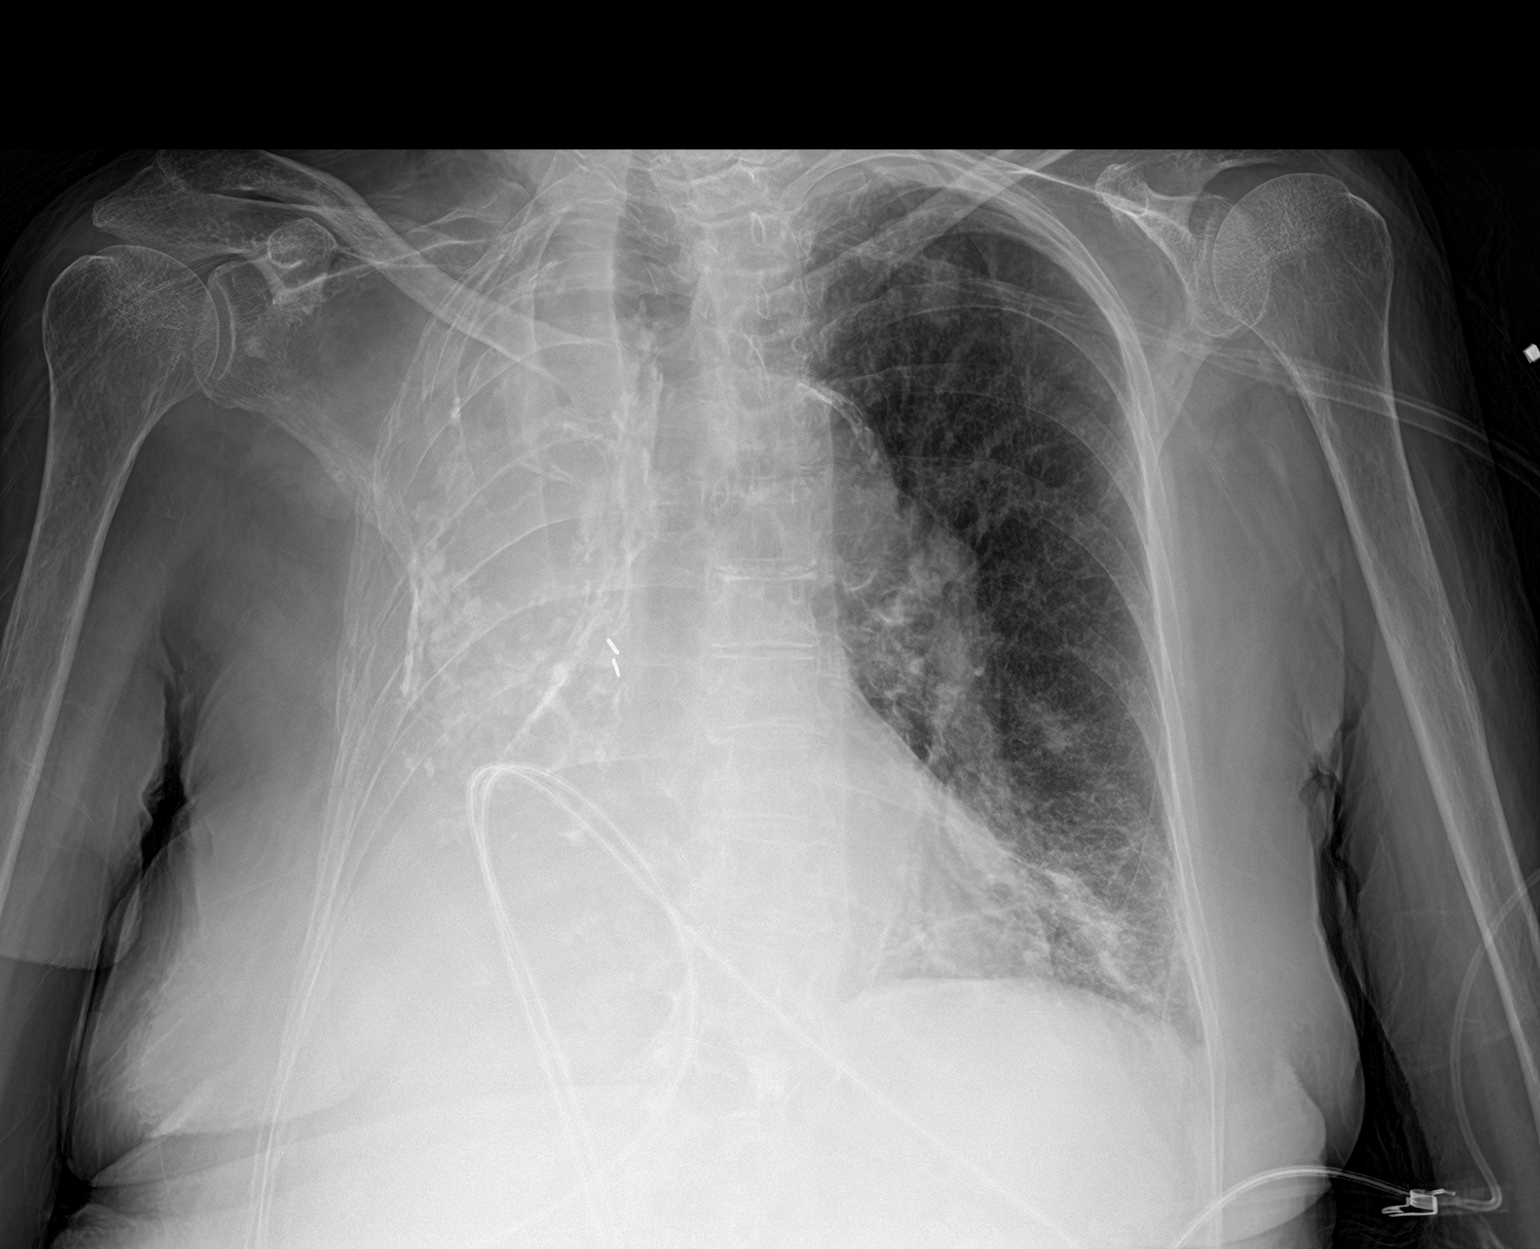

[2 of 2 positions shown; findings below may reference images not displayed]

FINDINGS: There is opacification of the right hemithorax. There is extensive
calcification as well as pleural effusion and thickening, likely due
to fibrosis from chronic empyema on the right.

There is postoperative change on the left. There is fibrosis
throughout much of the left lung with scarring in the left base.
There is a nodular opacity in the left upper lobe measuring 1.8 x
1.0 cm, not present previously.

Heart is upper normal in size with mild prominence of the pulmonary
vascularity on the left, likely due to blood flow essentially
exclusively handle by the left lung. There is aortic
atherosclerosis. No adenopathy evident. There are no evident bone
lesions. Bones are osteoporotic.
IMPRESSION: 1. 1.8 x 1.0 cm nodular opacity left upper lobe, not seen
previously. Advise noncontrast enhanced chest CT to further assess.
Neoplastic focus in the left upper lobe must be of concern in this
circumstance.

2. On the right, there is diffuse opacification and calcification,
likely due to chronic fibrosis with questionable associated chronic
effusion. The overall appearance with calcifications suggest changes
of chronic empyema on the right.

3.  Fibrosis throughout the left lung with scarring left base.

4. Stable cardiac silhouette. Prominence of pulmonary vascularity on
the left is likely due to redistribution of blood flow to viable
lung segments.

5.  Aortic atherosclerosis.

6.  Bones osteoporotic.

Aortic Atherosclerosis ([id number]).

## 2017-12-02 DIAGNOSIS — Z23 Encounter for immunization: Secondary | ICD-10-CM | POA: Diagnosis not present

## 2017-12-02 DIAGNOSIS — Z515 Encounter for palliative care: Secondary | ICD-10-CM | POA: Diagnosis not present

## 2017-12-02 DIAGNOSIS — J9611 Chronic respiratory failure with hypoxia: Secondary | ICD-10-CM | POA: Diagnosis not present

## 2017-12-16 ENCOUNTER — Telehealth: Payer: Self-pay | Admitting: Pulmonary Disease

## 2017-12-16 NOTE — Telephone Encounter (Signed)
Hospice has bee seeing pt for pallative and the NP has recommended for hospice. Pt would like Dr. Alva Garnet to sign orders for hospice. Please call to discuss.

## 2017-12-16 NOTE — Telephone Encounter (Signed)
Returned call to Langley Gauss as Neoma Laming was out of the office. Pts pcp actually initiated hospice but patient want DS to be attending. Advised to let the pcp be the attending and if anything is needed from a pulmonary standpoint DS can assist. Please feel free to contact our office. Nothing further needed.

## 2017-12-17 DIAGNOSIS — H409 Unspecified glaucoma: Secondary | ICD-10-CM | POA: Diagnosis not present

## 2017-12-17 DIAGNOSIS — Z902 Acquired absence of lung [part of]: Secondary | ICD-10-CM | POA: Diagnosis not present

## 2017-12-17 DIAGNOSIS — Z87891 Personal history of nicotine dependence: Secondary | ICD-10-CM | POA: Diagnosis not present

## 2017-12-17 DIAGNOSIS — G2581 Restless legs syndrome: Secondary | ICD-10-CM | POA: Diagnosis not present

## 2017-12-17 DIAGNOSIS — C3412 Malignant neoplasm of upper lobe, left bronchus or lung: Secondary | ICD-10-CM | POA: Diagnosis not present

## 2017-12-17 DIAGNOSIS — R627 Adult failure to thrive: Secondary | ICD-10-CM | POA: Diagnosis not present

## 2017-12-17 DIAGNOSIS — R634 Abnormal weight loss: Secondary | ICD-10-CM | POA: Diagnosis not present

## 2017-12-17 DIAGNOSIS — J449 Chronic obstructive pulmonary disease, unspecified: Secondary | ICD-10-CM | POA: Diagnosis not present

## 2017-12-17 DIAGNOSIS — Z681 Body mass index (BMI) 19 or less, adult: Secondary | ICD-10-CM | POA: Diagnosis not present

## 2017-12-17 DIAGNOSIS — J9611 Chronic respiratory failure with hypoxia: Secondary | ICD-10-CM | POA: Diagnosis not present

## 2017-12-17 DIAGNOSIS — Z8701 Personal history of pneumonia (recurrent): Secondary | ICD-10-CM | POA: Diagnosis not present

## 2017-12-17 DIAGNOSIS — Z9981 Dependence on supplemental oxygen: Secondary | ICD-10-CM | POA: Diagnosis not present

## 2017-12-18 DIAGNOSIS — C3412 Malignant neoplasm of upper lobe, left bronchus or lung: Secondary | ICD-10-CM | POA: Diagnosis not present

## 2017-12-18 DIAGNOSIS — Z902 Acquired absence of lung [part of]: Secondary | ICD-10-CM | POA: Diagnosis not present

## 2017-12-18 DIAGNOSIS — R627 Adult failure to thrive: Secondary | ICD-10-CM | POA: Diagnosis not present

## 2017-12-18 DIAGNOSIS — J9611 Chronic respiratory failure with hypoxia: Secondary | ICD-10-CM | POA: Diagnosis not present

## 2017-12-18 DIAGNOSIS — R634 Abnormal weight loss: Secondary | ICD-10-CM | POA: Diagnosis not present

## 2017-12-18 DIAGNOSIS — J449 Chronic obstructive pulmonary disease, unspecified: Secondary | ICD-10-CM | POA: Diagnosis not present

## 2017-12-22 DIAGNOSIS — H401131 Primary open-angle glaucoma, bilateral, mild stage: Secondary | ICD-10-CM | POA: Diagnosis not present

## 2017-12-24 DIAGNOSIS — Z902 Acquired absence of lung [part of]: Secondary | ICD-10-CM | POA: Diagnosis not present

## 2017-12-24 DIAGNOSIS — J449 Chronic obstructive pulmonary disease, unspecified: Secondary | ICD-10-CM | POA: Diagnosis not present

## 2017-12-24 DIAGNOSIS — J9611 Chronic respiratory failure with hypoxia: Secondary | ICD-10-CM | POA: Diagnosis not present

## 2017-12-24 DIAGNOSIS — R634 Abnormal weight loss: Secondary | ICD-10-CM | POA: Diagnosis not present

## 2017-12-24 DIAGNOSIS — C3412 Malignant neoplasm of upper lobe, left bronchus or lung: Secondary | ICD-10-CM | POA: Diagnosis not present

## 2017-12-24 DIAGNOSIS — R627 Adult failure to thrive: Secondary | ICD-10-CM | POA: Diagnosis not present

## 2017-12-30 DIAGNOSIS — Z902 Acquired absence of lung [part of]: Secondary | ICD-10-CM | POA: Diagnosis not present

## 2017-12-30 DIAGNOSIS — R627 Adult failure to thrive: Secondary | ICD-10-CM | POA: Diagnosis not present

## 2017-12-30 DIAGNOSIS — J449 Chronic obstructive pulmonary disease, unspecified: Secondary | ICD-10-CM | POA: Diagnosis not present

## 2017-12-30 DIAGNOSIS — R634 Abnormal weight loss: Secondary | ICD-10-CM | POA: Diagnosis not present

## 2017-12-30 DIAGNOSIS — C3412 Malignant neoplasm of upper lobe, left bronchus or lung: Secondary | ICD-10-CM | POA: Diagnosis not present

## 2017-12-30 DIAGNOSIS — J9611 Chronic respiratory failure with hypoxia: Secondary | ICD-10-CM | POA: Diagnosis not present

## 2017-12-31 DIAGNOSIS — Z9981 Dependence on supplemental oxygen: Secondary | ICD-10-CM | POA: Diagnosis not present

## 2017-12-31 DIAGNOSIS — J9611 Chronic respiratory failure with hypoxia: Secondary | ICD-10-CM | POA: Diagnosis not present

## 2017-12-31 DIAGNOSIS — C3412 Malignant neoplasm of upper lobe, left bronchus or lung: Secondary | ICD-10-CM | POA: Diagnosis not present

## 2017-12-31 DIAGNOSIS — J449 Chronic obstructive pulmonary disease, unspecified: Secondary | ICD-10-CM | POA: Diagnosis not present

## 2017-12-31 DIAGNOSIS — R634 Abnormal weight loss: Secondary | ICD-10-CM | POA: Diagnosis not present

## 2017-12-31 DIAGNOSIS — R627 Adult failure to thrive: Secondary | ICD-10-CM | POA: Diagnosis not present

## 2017-12-31 DIAGNOSIS — Z902 Acquired absence of lung [part of]: Secondary | ICD-10-CM | POA: Diagnosis not present

## 2017-12-31 DIAGNOSIS — Z8701 Personal history of pneumonia (recurrent): Secondary | ICD-10-CM | POA: Diagnosis not present

## 2018-01-01 DIAGNOSIS — Z5181 Encounter for therapeutic drug level monitoring: Secondary | ICD-10-CM | POA: Diagnosis not present

## 2018-01-07 DIAGNOSIS — J9611 Chronic respiratory failure with hypoxia: Secondary | ICD-10-CM | POA: Diagnosis not present

## 2018-01-07 DIAGNOSIS — R634 Abnormal weight loss: Secondary | ICD-10-CM | POA: Diagnosis not present

## 2018-01-07 DIAGNOSIS — Z902 Acquired absence of lung [part of]: Secondary | ICD-10-CM | POA: Diagnosis not present

## 2018-01-07 DIAGNOSIS — R627 Adult failure to thrive: Secondary | ICD-10-CM | POA: Diagnosis not present

## 2018-01-07 DIAGNOSIS — J449 Chronic obstructive pulmonary disease, unspecified: Secondary | ICD-10-CM | POA: Diagnosis not present

## 2018-01-07 DIAGNOSIS — C3412 Malignant neoplasm of upper lobe, left bronchus or lung: Secondary | ICD-10-CM | POA: Diagnosis not present

## 2018-01-08 DIAGNOSIS — J9611 Chronic respiratory failure with hypoxia: Secondary | ICD-10-CM | POA: Diagnosis not present

## 2018-01-08 DIAGNOSIS — Z5181 Encounter for therapeutic drug level monitoring: Secondary | ICD-10-CM | POA: Diagnosis not present

## 2018-01-08 DIAGNOSIS — Z0000 Encounter for general adult medical examination without abnormal findings: Secondary | ICD-10-CM | POA: Diagnosis not present

## 2018-01-08 DIAGNOSIS — M810 Age-related osteoporosis without current pathological fracture: Secondary | ICD-10-CM | POA: Diagnosis not present

## 2018-01-08 DIAGNOSIS — J432 Centrilobular emphysema: Secondary | ICD-10-CM | POA: Diagnosis not present

## 2018-01-14 DIAGNOSIS — J449 Chronic obstructive pulmonary disease, unspecified: Secondary | ICD-10-CM | POA: Diagnosis not present

## 2018-01-14 DIAGNOSIS — R634 Abnormal weight loss: Secondary | ICD-10-CM | POA: Diagnosis not present

## 2018-01-14 DIAGNOSIS — C3412 Malignant neoplasm of upper lobe, left bronchus or lung: Secondary | ICD-10-CM | POA: Diagnosis not present

## 2018-01-14 DIAGNOSIS — J9611 Chronic respiratory failure with hypoxia: Secondary | ICD-10-CM | POA: Diagnosis not present

## 2018-01-14 DIAGNOSIS — R627 Adult failure to thrive: Secondary | ICD-10-CM | POA: Diagnosis not present

## 2018-01-14 DIAGNOSIS — Z902 Acquired absence of lung [part of]: Secondary | ICD-10-CM | POA: Diagnosis not present

## 2018-01-16 DIAGNOSIS — R634 Abnormal weight loss: Secondary | ICD-10-CM | POA: Diagnosis not present

## 2018-01-16 DIAGNOSIS — G2581 Restless legs syndrome: Secondary | ICD-10-CM | POA: Diagnosis not present

## 2018-01-16 DIAGNOSIS — R627 Adult failure to thrive: Secondary | ICD-10-CM | POA: Diagnosis not present

## 2018-01-16 DIAGNOSIS — J9611 Chronic respiratory failure with hypoxia: Secondary | ICD-10-CM | POA: Diagnosis not present

## 2018-01-16 DIAGNOSIS — Z87891 Personal history of nicotine dependence: Secondary | ICD-10-CM | POA: Diagnosis not present

## 2018-01-16 DIAGNOSIS — J449 Chronic obstructive pulmonary disease, unspecified: Secondary | ICD-10-CM | POA: Diagnosis not present

## 2018-01-16 DIAGNOSIS — Z9981 Dependence on supplemental oxygen: Secondary | ICD-10-CM | POA: Diagnosis not present

## 2018-01-16 DIAGNOSIS — Z681 Body mass index (BMI) 19 or less, adult: Secondary | ICD-10-CM | POA: Diagnosis not present

## 2018-01-16 DIAGNOSIS — H409 Unspecified glaucoma: Secondary | ICD-10-CM | POA: Diagnosis not present

## 2018-01-16 DIAGNOSIS — Z902 Acquired absence of lung [part of]: Secondary | ICD-10-CM | POA: Diagnosis not present

## 2018-01-16 DIAGNOSIS — Z8701 Personal history of pneumonia (recurrent): Secondary | ICD-10-CM | POA: Diagnosis not present

## 2018-01-16 DIAGNOSIS — C3412 Malignant neoplasm of upper lobe, left bronchus or lung: Secondary | ICD-10-CM | POA: Diagnosis not present

## 2018-01-21 DIAGNOSIS — J449 Chronic obstructive pulmonary disease, unspecified: Secondary | ICD-10-CM | POA: Diagnosis not present

## 2018-01-21 DIAGNOSIS — C3412 Malignant neoplasm of upper lobe, left bronchus or lung: Secondary | ICD-10-CM | POA: Diagnosis not present

## 2018-01-21 DIAGNOSIS — R627 Adult failure to thrive: Secondary | ICD-10-CM | POA: Diagnosis not present

## 2018-01-21 DIAGNOSIS — J9611 Chronic respiratory failure with hypoxia: Secondary | ICD-10-CM | POA: Diagnosis not present

## 2018-01-21 DIAGNOSIS — Z902 Acquired absence of lung [part of]: Secondary | ICD-10-CM | POA: Diagnosis not present

## 2018-01-21 DIAGNOSIS — R634 Abnormal weight loss: Secondary | ICD-10-CM | POA: Diagnosis not present

## 2018-01-28 DIAGNOSIS — R627 Adult failure to thrive: Secondary | ICD-10-CM | POA: Diagnosis not present

## 2018-01-28 DIAGNOSIS — C3412 Malignant neoplasm of upper lobe, left bronchus or lung: Secondary | ICD-10-CM | POA: Diagnosis not present

## 2018-01-28 DIAGNOSIS — J9611 Chronic respiratory failure with hypoxia: Secondary | ICD-10-CM | POA: Diagnosis not present

## 2018-01-28 DIAGNOSIS — Z902 Acquired absence of lung [part of]: Secondary | ICD-10-CM | POA: Diagnosis not present

## 2018-01-28 DIAGNOSIS — R634 Abnormal weight loss: Secondary | ICD-10-CM | POA: Diagnosis not present

## 2018-01-28 DIAGNOSIS — J449 Chronic obstructive pulmonary disease, unspecified: Secondary | ICD-10-CM | POA: Diagnosis not present

## 2018-02-04 ENCOUNTER — Telehealth: Payer: Self-pay | Admitting: Pulmonary Disease

## 2018-02-04 DIAGNOSIS — J449 Chronic obstructive pulmonary disease, unspecified: Secondary | ICD-10-CM | POA: Diagnosis not present

## 2018-02-04 DIAGNOSIS — C3412 Malignant neoplasm of upper lobe, left bronchus or lung: Secondary | ICD-10-CM | POA: Diagnosis not present

## 2018-02-04 DIAGNOSIS — R627 Adult failure to thrive: Secondary | ICD-10-CM | POA: Diagnosis not present

## 2018-02-04 DIAGNOSIS — Z902 Acquired absence of lung [part of]: Secondary | ICD-10-CM | POA: Diagnosis not present

## 2018-02-04 DIAGNOSIS — R634 Abnormal weight loss: Secondary | ICD-10-CM | POA: Diagnosis not present

## 2018-02-04 DIAGNOSIS — J9611 Chronic respiratory failure with hypoxia: Secondary | ICD-10-CM | POA: Diagnosis not present

## 2018-02-04 NOTE — Telephone Encounter (Signed)
Spoke to patient, let her know that she is to get a chest x-ray before apt and the order is entered. Patient aware.

## 2018-02-04 NOTE — Telephone Encounter (Signed)
Patient is scheduled to see Dr. Alva Garnet on Fri. 11/22 Patient would like to know if she will need a chest x ray or any tests prior to appointment Please call to discuss

## 2018-02-06 ENCOUNTER — Ambulatory Visit (INDEPENDENT_AMBULATORY_CARE_PROVIDER_SITE_OTHER): Admitting: Pulmonary Disease

## 2018-02-06 ENCOUNTER — Ambulatory Visit
Admission: RE | Admit: 2018-02-06 | Discharge: 2018-02-06 | Disposition: A | Discharge status: Home / Self Care | Source: Ambulatory Visit | Attending: Pulmonary Disease | Admitting: Pulmonary Disease

## 2018-02-06 ENCOUNTER — Encounter: Payer: Self-pay | Admitting: Pulmonary Disease

## 2018-02-06 VITALS — BP 100/60 | HR 100 | Resp 16 | Ht 64.0 in | Wt 111.0 lb

## 2018-02-06 DIAGNOSIS — J9611 Chronic respiratory failure with hypoxia: Secondary | ICD-10-CM | POA: Diagnosis not present

## 2018-02-06 DIAGNOSIS — R911 Solitary pulmonary nodule: Secondary | ICD-10-CM | POA: Diagnosis not present

## 2018-02-06 DIAGNOSIS — J449 Chronic obstructive pulmonary disease, unspecified: Secondary | ICD-10-CM | POA: Diagnosis not present

## 2018-02-06 DIAGNOSIS — Z902 Acquired absence of lung [part of]: Secondary | ICD-10-CM | POA: Insufficient documentation

## 2018-02-06 DIAGNOSIS — I517 Cardiomegaly: Secondary | ICD-10-CM | POA: Diagnosis not present

## 2018-02-06 NOTE — Patient Instructions (Signed)
Continue oxygen therapy is close to 24 hours/day as possible Continue scheduled nebulizer treatments 2 times a day and you may take an extra nebulizer treatment in the middle of the day Follow-up in 6 months.  Call sooner if needed

## 2018-02-08 ENCOUNTER — Encounter: Payer: Self-pay | Admitting: Pulmonary Disease

## 2018-02-08 NOTE — Progress Notes (Signed)
PULMONARY OFFICE FOLLOW-UP NOTE  Requesting MD/Service: [facility] hospitalist service Date of initial consultation: 03/19/17 Reason for consultation:S/P pneumonectomy, new LUL lung nodule  PT PROFILE: 84 y.o. female previously followed by Dr. Raul Del, remote former smoker, history of R pneumonectomy in 1980s for lung cancer, hospitalized 01/2017 for pneumonia.  CXR and CT scan revealed LUL nodule.  DATA: CT chest 01/24/17: Status post R pneumonectomy. Irregular solid 1.4 cm anterior left upper lobe pulmonary nodule, increased in size from 0.7 cm on 10/2004 chest CT, suspicious for a slowly growing primary bronchogenic carcinoma.   CT chest 05/05/17: Stable spiculated anterior left upper lobe pulmonary nodule. The appearance and change since 2006 remains suspicious for a slowly growing primary bronchogenic carcinoma  INTERVAL: Last seen 11/03/2017.  No major pulmonary events in the interim  SUBJ:  This is a scheduled ROV.  She has no new complaints.  She remains markedly limited by exertional dyspnea.  She is no longer on Breo and Spiriva due to no perceived benefit.  She rarely uses nebulized BDs (Duoneb) 2x per day.  She continues to wear oxygen 24 hours/day.  She denies CP, fever, purulent sputum, hemoptysis, LE edema and calf tenderness.  Vitals:   02/06/18 0930 02/06/18 0936  BP:  100/60  Pulse:  100  Resp: 16   SpO2:  90%  Weight: 111 lb (50.3 kg)   Height: 5\' 4"  (1.626 m)   2 LPM [place] (pulse)   EXAM:  Gen: Frail, pleasant, NAD HEENT: NCAT, sclerae white Neck: No JVD noted, no LAN Lungs: Absent breath sounds on R, diminished on L without adventitious sounds Cardiovascular: Regular, no M Abdomen: Soft, NABS Ext: No C/C/E Neuro: No focal deficits  DATA:   BMP Latest Ref Rng & Units 01/25/2017 01/24/2017 04/01/2016  Glucose 65 - 99 mg/dL 148(H) 138(H) 121(H)  BUN 6 - 20 mg/dL 19 11 20   Creatinine 0.44 - 1.00 mg/dL 0.56 0.56 0.44  Sodium 135 - 145 mmol/L 136 138 143  Potassium  3.5 - 5.1 mmol/L 4.1 3.9 3.4(L)  Chloride 101 - 111 mmol/L 97(L) 94(L) 95(L)  CO2 22 - 32 mmol/L 28 32 38(H)  Calcium 8.9 - 10.3 mg/dL 8.6(L) 9.1 8.9    CBC Latest Ref Rng & Units 01/25/2017 01/24/2017 04/01/2016  WBC 3.6 - 11.0 K/uL 10.9 13.2(H) 11.2(H)  Hemoglobin 12.0 - 16.0 g/dL 14.8 17.0(H) 15.2  Hematocrit 35.0 - 47.0 % 44.1 51.2(H) 44.6  Platelets 150 - 440 K/uL 156 167 206    CXR: R pneumonectomy, NSC LUL nodule  IMPRESSION:     ICD-10-CM   1. Status post pneumonectomy Z90.2   2. Lung nodule R91.1   3. Chronic hypoxemic respiratory failure (HCC) J96.11   4. Chronic obstructive pulmonary disease, unspecified COPD type ([place]) J44.9    We again discussed the LUL nodule and she again confirms that she wishes to forgo any further eval. She is presently being followed by Hospice and is being seen weekly by them   PLAN:  Continue oxygen therapy is close to 24 hours/day as possible Continue nebulized BDs as needed Follow-up in 6 months or sooner as needed  Merton Border, MD PCCM service Mobile [phone] Pager [phone] 02/08/2018 4:39 PM

## 2018-02-11 DIAGNOSIS — Z902 Acquired absence of lung [part of]: Secondary | ICD-10-CM | POA: Diagnosis not present

## 2018-02-11 DIAGNOSIS — C3412 Malignant neoplasm of upper lobe, left bronchus or lung: Secondary | ICD-10-CM | POA: Diagnosis not present

## 2018-02-11 DIAGNOSIS — R627 Adult failure to thrive: Secondary | ICD-10-CM | POA: Diagnosis not present

## 2018-02-11 DIAGNOSIS — J449 Chronic obstructive pulmonary disease, unspecified: Secondary | ICD-10-CM | POA: Diagnosis not present

## 2018-02-11 DIAGNOSIS — R634 Abnormal weight loss: Secondary | ICD-10-CM | POA: Diagnosis not present

## 2018-02-11 DIAGNOSIS — J9611 Chronic respiratory failure with hypoxia: Secondary | ICD-10-CM | POA: Diagnosis not present

## 2018-02-15 DIAGNOSIS — G2581 Restless legs syndrome: Secondary | ICD-10-CM | POA: Diagnosis not present

## 2018-02-15 DIAGNOSIS — J9611 Chronic respiratory failure with hypoxia: Secondary | ICD-10-CM | POA: Diagnosis not present

## 2018-02-15 DIAGNOSIS — J449 Chronic obstructive pulmonary disease, unspecified: Secondary | ICD-10-CM | POA: Diagnosis not present

## 2018-02-15 DIAGNOSIS — Z87891 Personal history of nicotine dependence: Secondary | ICD-10-CM | POA: Diagnosis not present

## 2018-02-15 DIAGNOSIS — Z902 Acquired absence of lung [part of]: Secondary | ICD-10-CM | POA: Diagnosis not present

## 2018-02-15 DIAGNOSIS — R634 Abnormal weight loss: Secondary | ICD-10-CM | POA: Diagnosis not present

## 2018-02-15 DIAGNOSIS — Z9981 Dependence on supplemental oxygen: Secondary | ICD-10-CM | POA: Diagnosis not present

## 2018-02-15 DIAGNOSIS — C3412 Malignant neoplasm of upper lobe, left bronchus or lung: Secondary | ICD-10-CM | POA: Diagnosis not present

## 2018-02-15 DIAGNOSIS — Z8701 Personal history of pneumonia (recurrent): Secondary | ICD-10-CM | POA: Diagnosis not present

## 2018-02-15 DIAGNOSIS — Z681 Body mass index (BMI) 19 or less, adult: Secondary | ICD-10-CM | POA: Diagnosis not present

## 2018-02-15 DIAGNOSIS — H409 Unspecified glaucoma: Secondary | ICD-10-CM | POA: Diagnosis not present

## 2018-02-15 DIAGNOSIS — R627 Adult failure to thrive: Secondary | ICD-10-CM | POA: Diagnosis not present

## 2018-02-18 DIAGNOSIS — R634 Abnormal weight loss: Secondary | ICD-10-CM | POA: Diagnosis not present

## 2018-02-18 DIAGNOSIS — C3412 Malignant neoplasm of upper lobe, left bronchus or lung: Secondary | ICD-10-CM | POA: Diagnosis not present

## 2018-02-18 DIAGNOSIS — R627 Adult failure to thrive: Secondary | ICD-10-CM | POA: Diagnosis not present

## 2018-02-18 DIAGNOSIS — J9611 Chronic respiratory failure with hypoxia: Secondary | ICD-10-CM | POA: Diagnosis not present

## 2018-02-18 DIAGNOSIS — Z902 Acquired absence of lung [part of]: Secondary | ICD-10-CM | POA: Diagnosis not present

## 2018-02-18 DIAGNOSIS — J449 Chronic obstructive pulmonary disease, unspecified: Secondary | ICD-10-CM | POA: Diagnosis not present

## 2018-02-25 DIAGNOSIS — C3412 Malignant neoplasm of upper lobe, left bronchus or lung: Secondary | ICD-10-CM | POA: Diagnosis not present

## 2018-02-25 DIAGNOSIS — R627 Adult failure to thrive: Secondary | ICD-10-CM | POA: Diagnosis not present

## 2018-02-25 DIAGNOSIS — R634 Abnormal weight loss: Secondary | ICD-10-CM | POA: Diagnosis not present

## 2018-02-25 DIAGNOSIS — J9611 Chronic respiratory failure with hypoxia: Secondary | ICD-10-CM | POA: Diagnosis not present

## 2018-02-25 DIAGNOSIS — Z902 Acquired absence of lung [part of]: Secondary | ICD-10-CM | POA: Diagnosis not present

## 2018-02-25 DIAGNOSIS — J449 Chronic obstructive pulmonary disease, unspecified: Secondary | ICD-10-CM | POA: Diagnosis not present

## 2018-03-04 DIAGNOSIS — R634 Abnormal weight loss: Secondary | ICD-10-CM | POA: Diagnosis not present

## 2018-03-04 DIAGNOSIS — R627 Adult failure to thrive: Secondary | ICD-10-CM | POA: Diagnosis not present

## 2018-03-04 DIAGNOSIS — J9611 Chronic respiratory failure with hypoxia: Secondary | ICD-10-CM | POA: Diagnosis not present

## 2018-03-04 DIAGNOSIS — C3412 Malignant neoplasm of upper lobe, left bronchus or lung: Secondary | ICD-10-CM | POA: Diagnosis not present

## 2018-03-04 DIAGNOSIS — Z902 Acquired absence of lung [part of]: Secondary | ICD-10-CM | POA: Diagnosis not present

## 2018-03-04 DIAGNOSIS — J449 Chronic obstructive pulmonary disease, unspecified: Secondary | ICD-10-CM | POA: Diagnosis not present

## 2018-03-05 DIAGNOSIS — R634 Abnormal weight loss: Secondary | ICD-10-CM | POA: Diagnosis not present

## 2018-03-05 DIAGNOSIS — Z902 Acquired absence of lung [part of]: Secondary | ICD-10-CM | POA: Diagnosis not present

## 2018-03-05 DIAGNOSIS — R627 Adult failure to thrive: Secondary | ICD-10-CM | POA: Diagnosis not present

## 2018-03-05 DIAGNOSIS — J449 Chronic obstructive pulmonary disease, unspecified: Secondary | ICD-10-CM | POA: Diagnosis not present

## 2018-03-05 DIAGNOSIS — C3412 Malignant neoplasm of upper lobe, left bronchus or lung: Secondary | ICD-10-CM | POA: Diagnosis not present

## 2018-03-05 DIAGNOSIS — J9611 Chronic respiratory failure with hypoxia: Secondary | ICD-10-CM | POA: Diagnosis not present

## 2018-03-12 DIAGNOSIS — C3412 Malignant neoplasm of upper lobe, left bronchus or lung: Secondary | ICD-10-CM | POA: Diagnosis not present

## 2018-03-12 DIAGNOSIS — R634 Abnormal weight loss: Secondary | ICD-10-CM | POA: Diagnosis not present

## 2018-03-12 DIAGNOSIS — J449 Chronic obstructive pulmonary disease, unspecified: Secondary | ICD-10-CM | POA: Diagnosis not present

## 2018-03-12 DIAGNOSIS — J9611 Chronic respiratory failure with hypoxia: Secondary | ICD-10-CM | POA: Diagnosis not present

## 2018-03-12 DIAGNOSIS — Z902 Acquired absence of lung [part of]: Secondary | ICD-10-CM | POA: Diagnosis not present

## 2018-03-12 DIAGNOSIS — R627 Adult failure to thrive: Secondary | ICD-10-CM | POA: Diagnosis not present

## 2018-03-30 DIAGNOSIS — Z9981 Dependence on supplemental oxygen: Secondary | ICD-10-CM | POA: Diagnosis not present

## 2018-03-30 DIAGNOSIS — R627 Adult failure to thrive: Secondary | ICD-10-CM | POA: Diagnosis not present

## 2018-03-30 DIAGNOSIS — Z902 Acquired absence of lung [part of]: Secondary | ICD-10-CM | POA: Diagnosis not present

## 2018-03-30 DIAGNOSIS — J9611 Chronic respiratory failure with hypoxia: Secondary | ICD-10-CM | POA: Diagnosis not present

## 2018-03-30 DIAGNOSIS — Z8701 Personal history of pneumonia (recurrent): Secondary | ICD-10-CM | POA: Diagnosis not present

## 2018-03-30 DIAGNOSIS — R634 Abnormal weight loss: Secondary | ICD-10-CM | POA: Diagnosis not present

## 2018-03-30 DIAGNOSIS — C3412 Malignant neoplasm of upper lobe, left bronchus or lung: Secondary | ICD-10-CM | POA: Diagnosis not present

## 2018-03-30 DIAGNOSIS — J449 Chronic obstructive pulmonary disease, unspecified: Secondary | ICD-10-CM | POA: Diagnosis not present

## 2018-06-10 IMAGING — CR DG CHEST 2V
1 series · 2 of 2 positions shown · non-contrast
Comparison: Chest CT 05/05/2017 and radiographs 04/11/2017

CLINICAL DATA: History of lung cancer status post right
pneumonectomy.

EXAM:
CHEST - 2 VIEW

[Series 1: dg chest 2 view · 0.14mm/px · 2 of 2 slices shown]
[im 1/2]
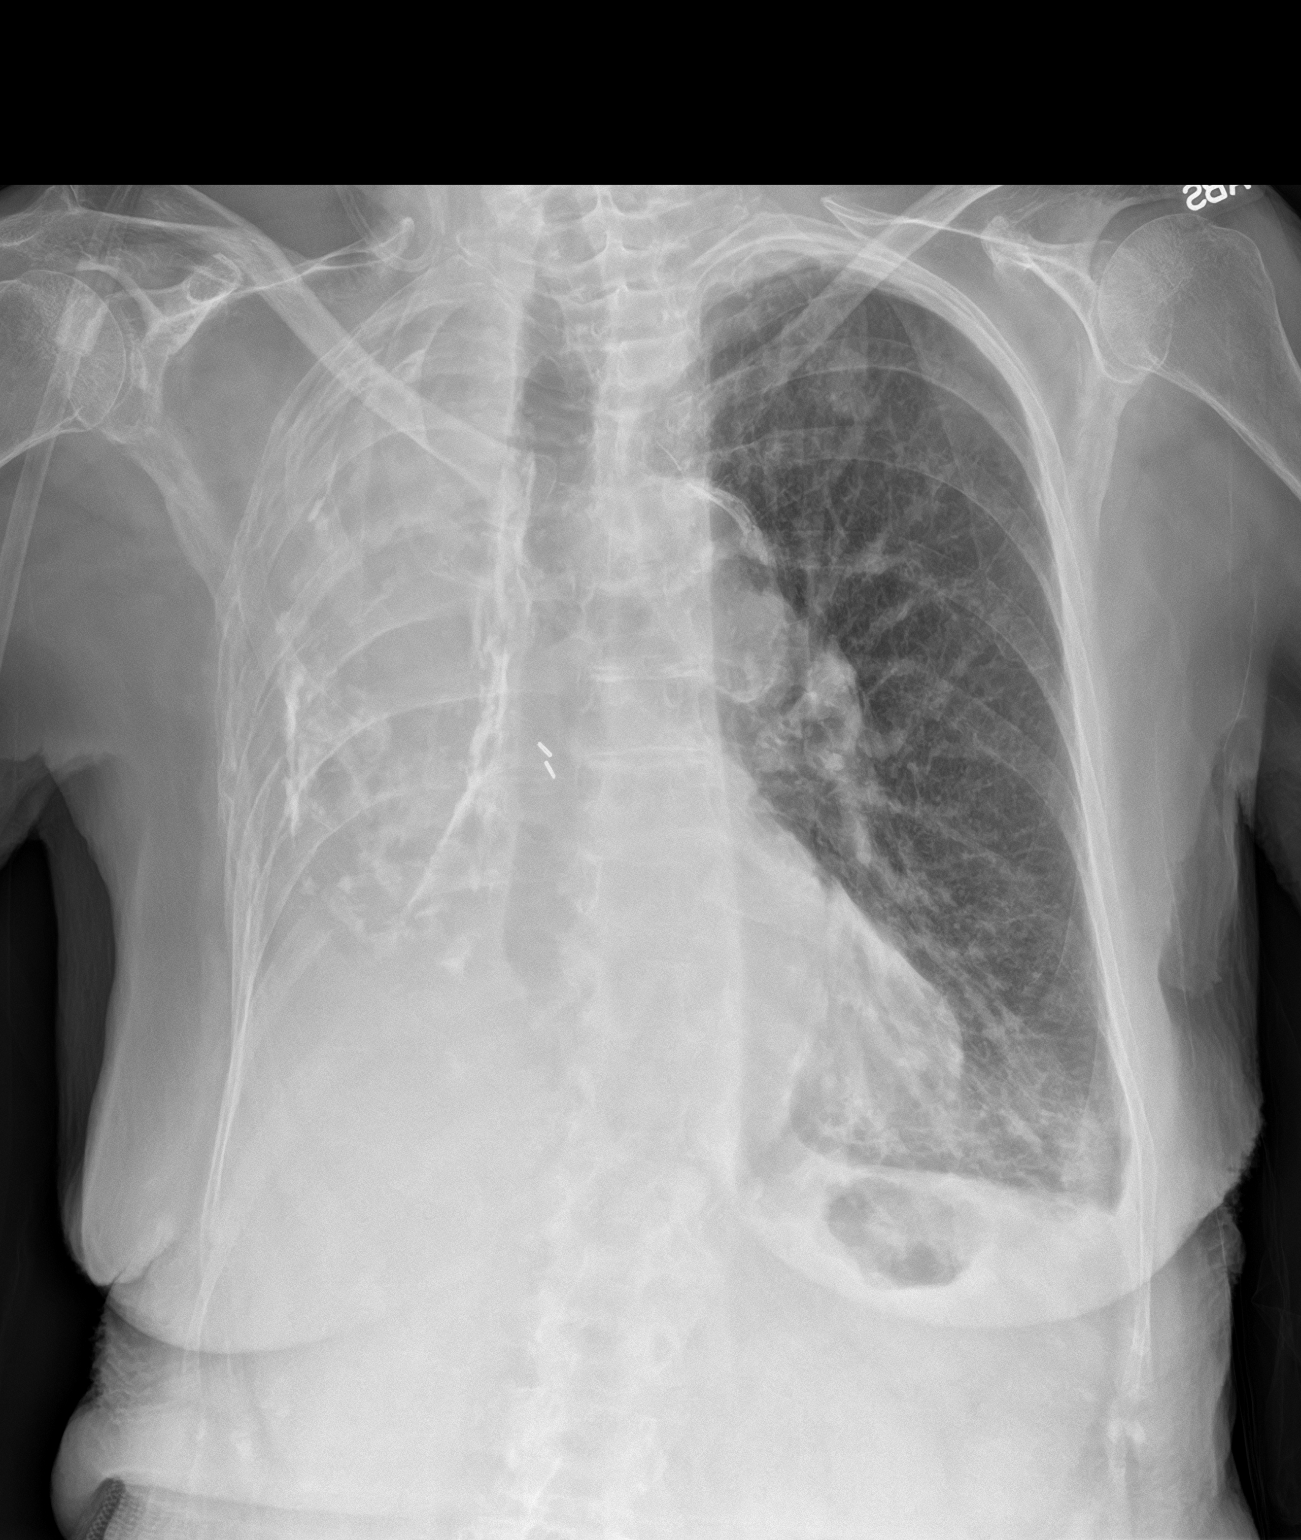
[im 2/2]
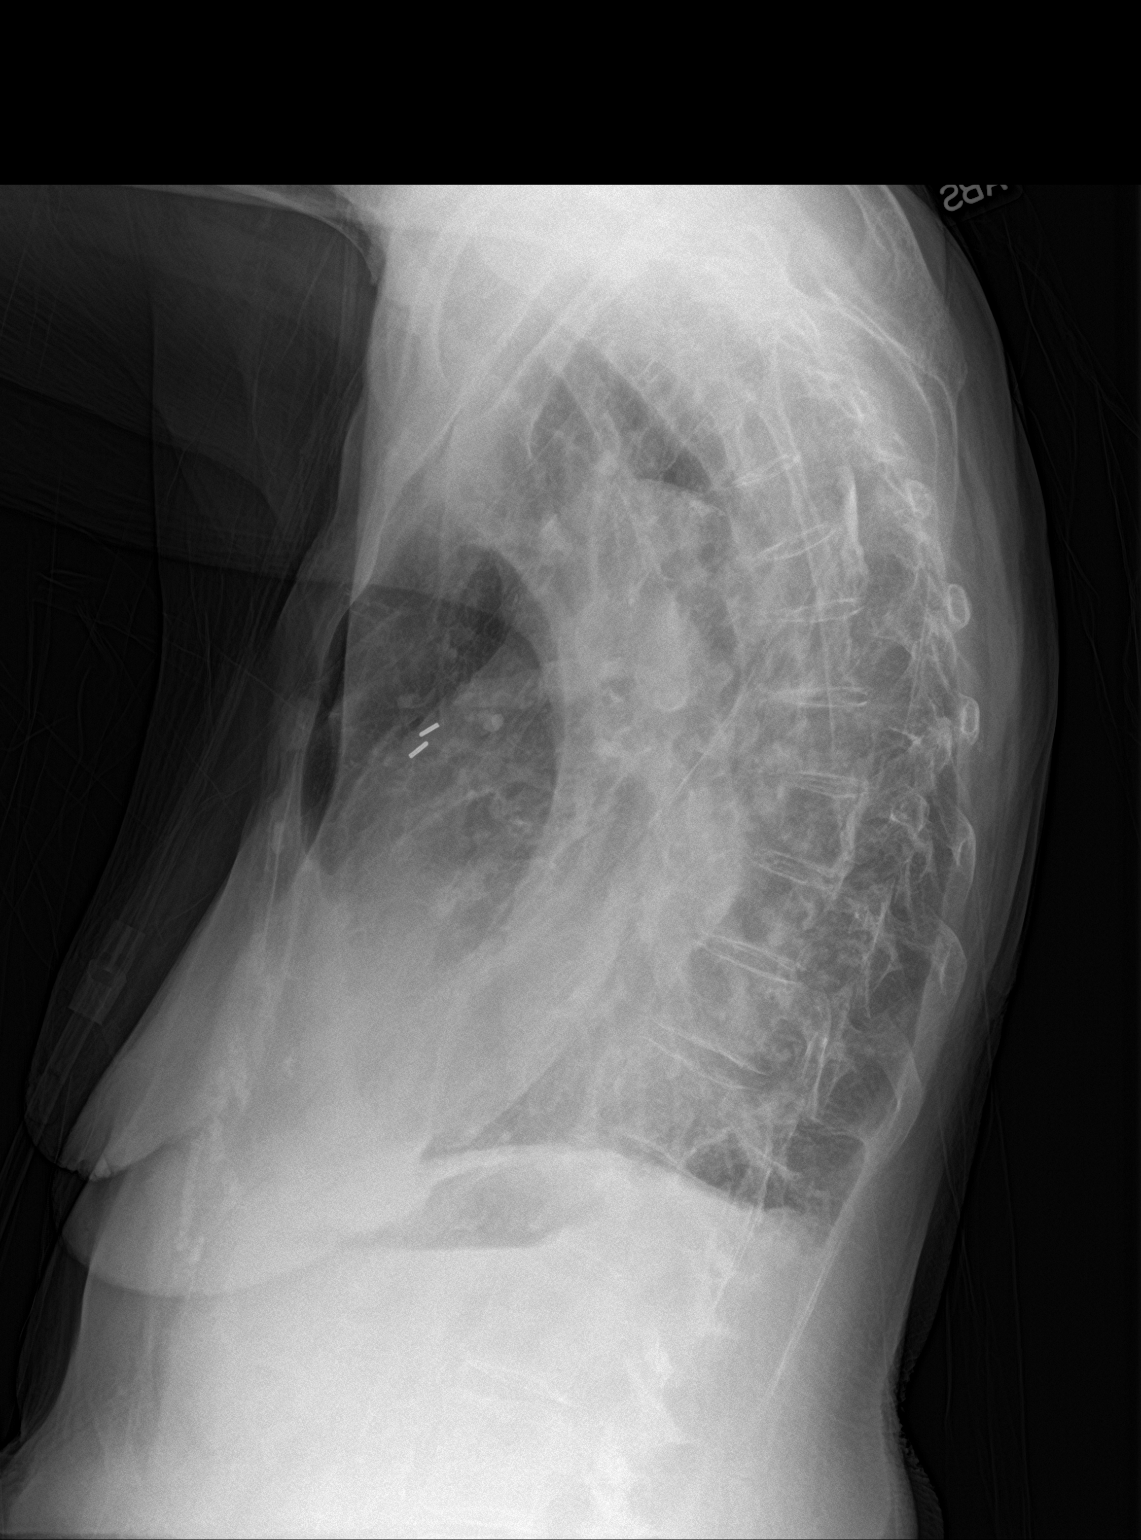

[2 of 2 positions shown; findings below may reference images not displayed]

FINDINGS: The right heart border is obscured. Aortic atherosclerosis is noted.
Sequelae of right pneumonectomy are again identified with unchanged
appearance of right hemithorax opacification, volume loss, and
pleural calcification. Approximately 1.7 cm left upper lobe nodule
is unchanged in size from the prior radiographs. Left basilar
scarring is also unchanged. No acute airspace consolidation, edema,
or pneumothorax is identified. No acute osseous abnormality is seen.
IMPRESSION: Unchanged appearance of the chest including left upper lobe nodule
and prior right pneumonectomy.

## 2018-07-06 DIAGNOSIS — J9611 Chronic respiratory failure with hypoxia: Secondary | ICD-10-CM | POA: Diagnosis not present

## 2018-07-13 DIAGNOSIS — R600 Localized edema: Secondary | ICD-10-CM | POA: Diagnosis not present

## 2018-07-13 DIAGNOSIS — J432 Centrilobular emphysema: Secondary | ICD-10-CM | POA: Diagnosis not present

## 2018-07-13 DIAGNOSIS — J9611 Chronic respiratory failure with hypoxia: Secondary | ICD-10-CM | POA: Diagnosis not present

## 2018-07-13 DIAGNOSIS — Z13220 Encounter for screening for lipoid disorders: Secondary | ICD-10-CM | POA: Diagnosis not present

## 2018-07-13 DIAGNOSIS — M810 Age-related osteoporosis without current pathological fracture: Secondary | ICD-10-CM | POA: Diagnosis not present

## 2018-08-06 ENCOUNTER — Ambulatory Visit: Payer: Medicare Other | Admitting: Pulmonary Disease

## 2018-08-11 ENCOUNTER — Ambulatory Visit (INDEPENDENT_AMBULATORY_CARE_PROVIDER_SITE_OTHER): Admitting: Pulmonary Disease

## 2018-08-11 ENCOUNTER — Other Ambulatory Visit: Payer: Self-pay

## 2018-08-11 ENCOUNTER — Encounter: Payer: Self-pay | Admitting: Pulmonary Disease

## 2018-08-11 VITALS — BP 130/72 | HR 98 | Temp 98.1°F | Ht 63.0 in | Wt 116.6 lb

## 2018-08-11 DIAGNOSIS — Z902 Acquired absence of lung [part of]: Secondary | ICD-10-CM | POA: Diagnosis not present

## 2018-08-11 DIAGNOSIS — R0602 Shortness of breath: Secondary | ICD-10-CM | POA: Diagnosis not present

## 2018-08-11 DIAGNOSIS — J449 Chronic obstructive pulmonary disease, unspecified: Secondary | ICD-10-CM

## 2018-08-11 DIAGNOSIS — J9611 Chronic respiratory failure with hypoxia: Secondary | ICD-10-CM

## 2018-08-11 DIAGNOSIS — I4949 Other premature depolarization: Secondary | ICD-10-CM

## 2018-08-11 DIAGNOSIS — R911 Solitary pulmonary nodule: Secondary | ICD-10-CM

## 2018-08-11 NOTE — Patient Instructions (Addendum)
Continue oxygen therapy as close to 24 hours/day as possible Continue nebulizer treatments (DuoNeb) 3 times a day Follow-up in 6 months with repeat chest x-ray at that time.  Call sooner if needed

## 2018-08-12 NOTE — Progress Notes (Signed)
PULMONARY OFFICE FOLLOW-UP NOTE  Requesting MD/Service: [facility] hospitalist service Date of initial consultation: 03/19/17 Reason for consultation:S/P pneumonectomy, new LUL lung nodule  PT PROFILE: 85 y.o. female previously followed by Dr. Raul Del, remote former smoker, history of R pneumonectomy in 1980s for lung cancer, hospitalized 01/2017 for pneumonia.  CXR and CT scan revealed LUL nodule.  DATA: CT chest 01/24/17: Status post R pneumonectomy. Irregular solid 1.4 cm anterior left upper lobe pulmonary nodule, increased in size from 0.7 cm on 10/2004 chest CT, suspicious for a slowly growing primary bronchogenic carcinoma.   CT chest 05/05/17: Stable spiculated anterior left upper lobe pulmonary nodule. The appearance and change since 2006 remains suspicious for a slowly growing primary bronchogenic carcinoma  INTERVAL: Last seen 02/06/2018.  No major pulmonary events in the interim  SUBJ:  This is a scheduled ROV.  She has no new complaints.  She feels that she is gradually progressing and feels significantly limited by exertional dyspnea.  She is able to dress and bathe and ambulate slowly in her home.  She is unable to do much more than that.  She lives in an assisted living facility.  She is using nebulized DuoNeb 3 times per day.  She is wearing oxygen close to 24 hours/day.  She denies CP, fever, purulent sputum, hemoptysis, LE edema and calf tenderness.  Vitals:   08/11/18 0958 08/11/18 1003  BP:  130/72  Pulse:  98  Temp:  98.1 F (36.7 C)  TempSrc:  Oral  SpO2:  93%  Weight: 116 lb 9.6 oz (52.9 kg)   Height: 5\' 3"  (1.6 m)   2 LPM [place] (pulse)   EXAM:  Gen: Frail appearing, pleasant as always, NAD HEENT: NCAT, sclerae white Neck: No JVD noted, no LAN Lungs: No adventitious sounds Cardiovascular: Irregular (frequent extrasystoles), no M noted Abdomen: Soft, NABS Ext: No C/C/E Neuro: No focal deficits noted  DATA:   BMP Latest Ref Rng & Units 01/25/2017 01/24/2017  04/01/2016  Glucose 65 - 99 mg/dL 148(H) 138(H) 121(H)  BUN 6 - 20 mg/dL 19 11 20   Creatinine 0.44 - 1.00 mg/dL 0.56 0.56 0.44  Sodium 135 - 145 mmol/L 136 138 143  Potassium 3.5 - 5.1 mmol/L 4.1 3.9 3.4(L)  Chloride 101 - 111 mmol/L 97(L) 94(L) 95(L)  CO2 22 - 32 mmol/L 28 32 38(H)  Calcium 8.9 - 10.3 mg/dL 8.6(L) 9.1 8.9    CBC Latest Ref Rng & Units 01/25/2017 01/24/2017 04/01/2016  WBC 3.6 - 11.0 K/uL 10.9 13.2(H) 11.2(H)  Hemoglobin 12.0 - 16.0 g/dL 14.8 17.0(H) 15.2  Hematocrit 35.0 - 47.0 % 44.1 51.2(H) 44.6  Platelets 150 - 440 K/uL 156 167 206    CXR: No new film  IMPRESSION:     ICD-10-CM   1. Chronic hypoxemic respiratory failure (HCC) J96.11   2. Status post pneumonectomy Z90.2   3. Lung nodule R91.1 DG Chest 2 View  4. Chronic obstructive pulmonary disease, unspecified COPD type ([place]) J44.9   5. Shortness of breath, gradually progressive R06.02   6. Frequent extrasystoles I49.49    The progressive dyspnea might be related to a cardiac cause (noting irregular rhythm on today's exam).  At her advanced age with a history of pneumonectomy and a lung nodule that almost certainly represents a slow-growing malignancy, she does not wish to pursue any aggressive evaluation.  Notably, she is followed by hospice.   PLAN:  Continue oxygen therapy is close to 24 hours/day as possible Continue nebulized BDs as she is  currently using Follow-up in 6 months with CXR prior to that visit.  Call sooner as needed  Merton Border, MD PCCM service Mobile [phone] Pager [phone] 08/12/2018 10:45 AM

## 2018-12-13 IMAGING — CR DG CHEST 2V
2 series · 2 of 2 positions shown · non-contrast
Comparison: 08/04/2017

CLINICAL DATA: Remote right pneumonectomy.

EXAM:
CHEST - 2 VIEW

[chest pa]
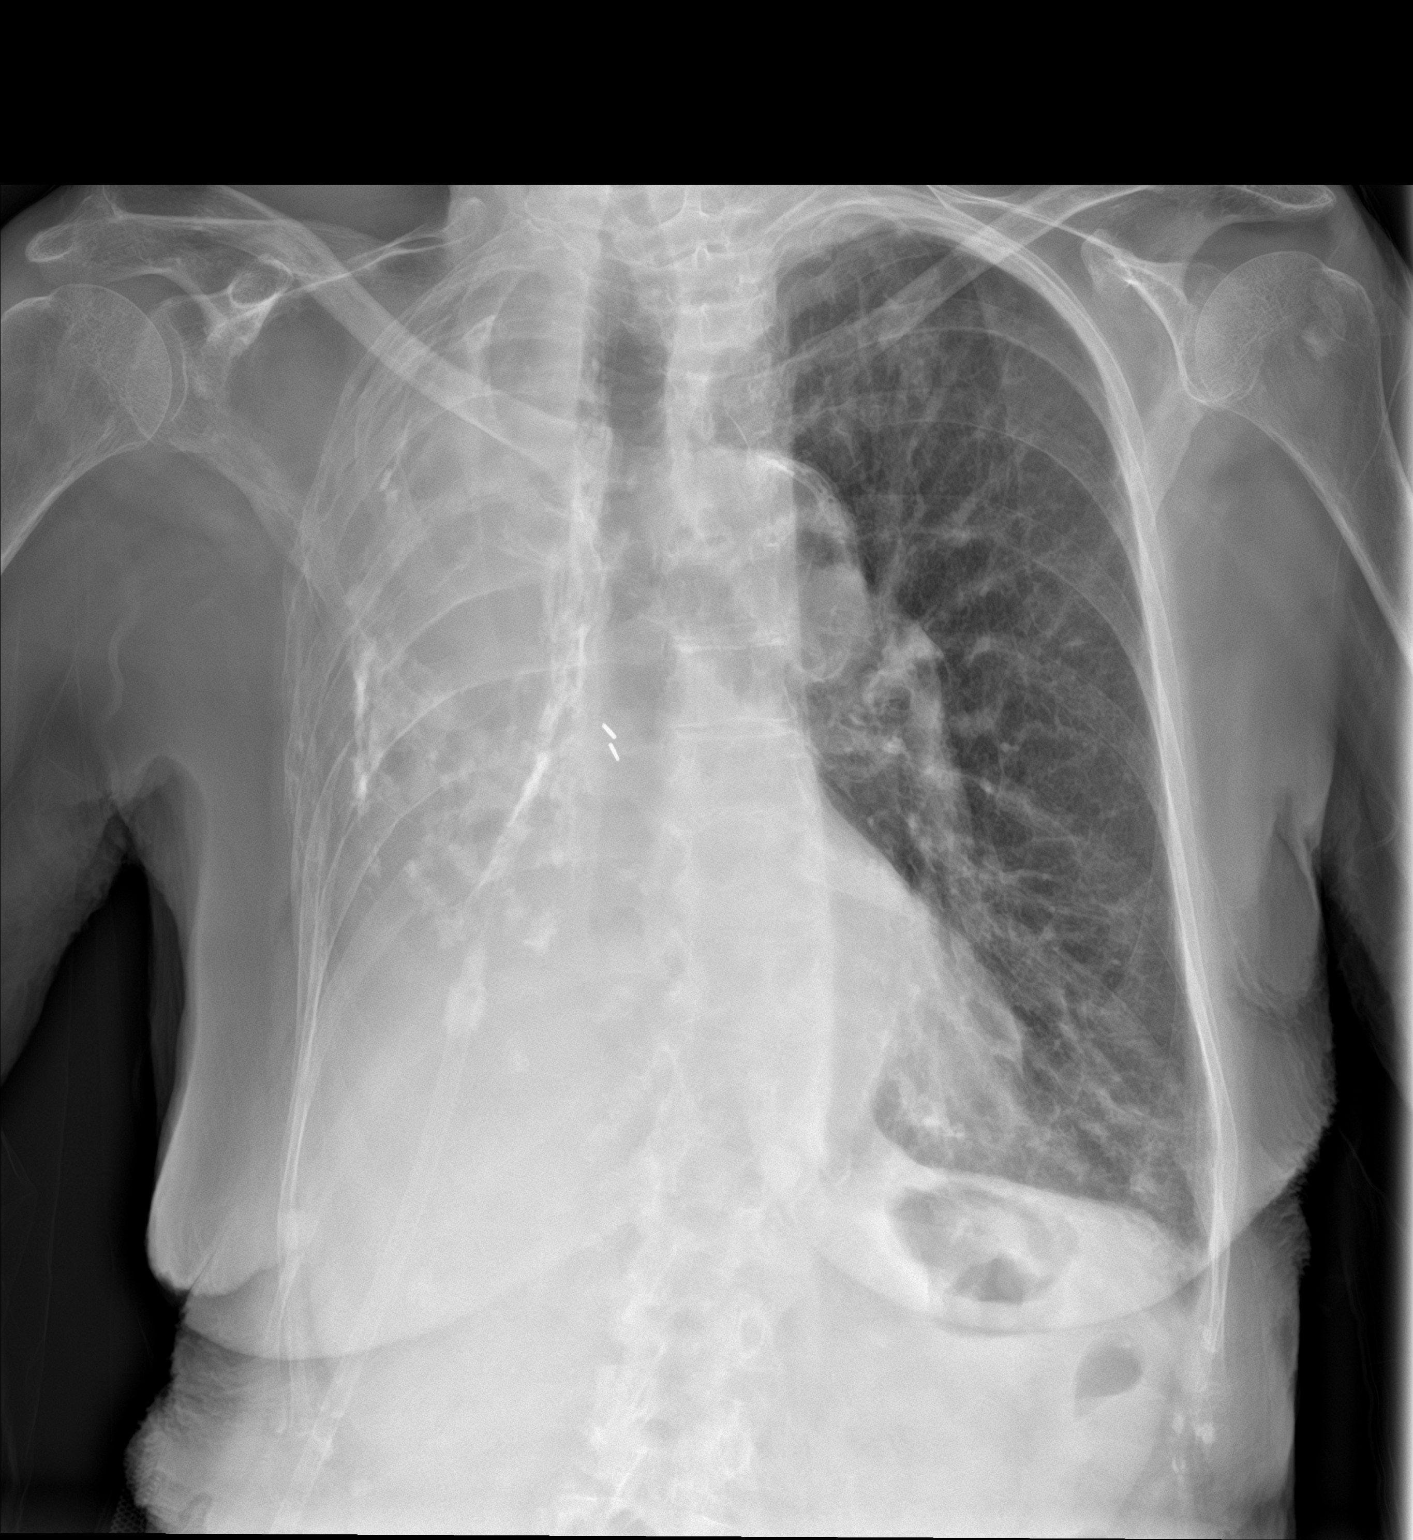

[chest lat]
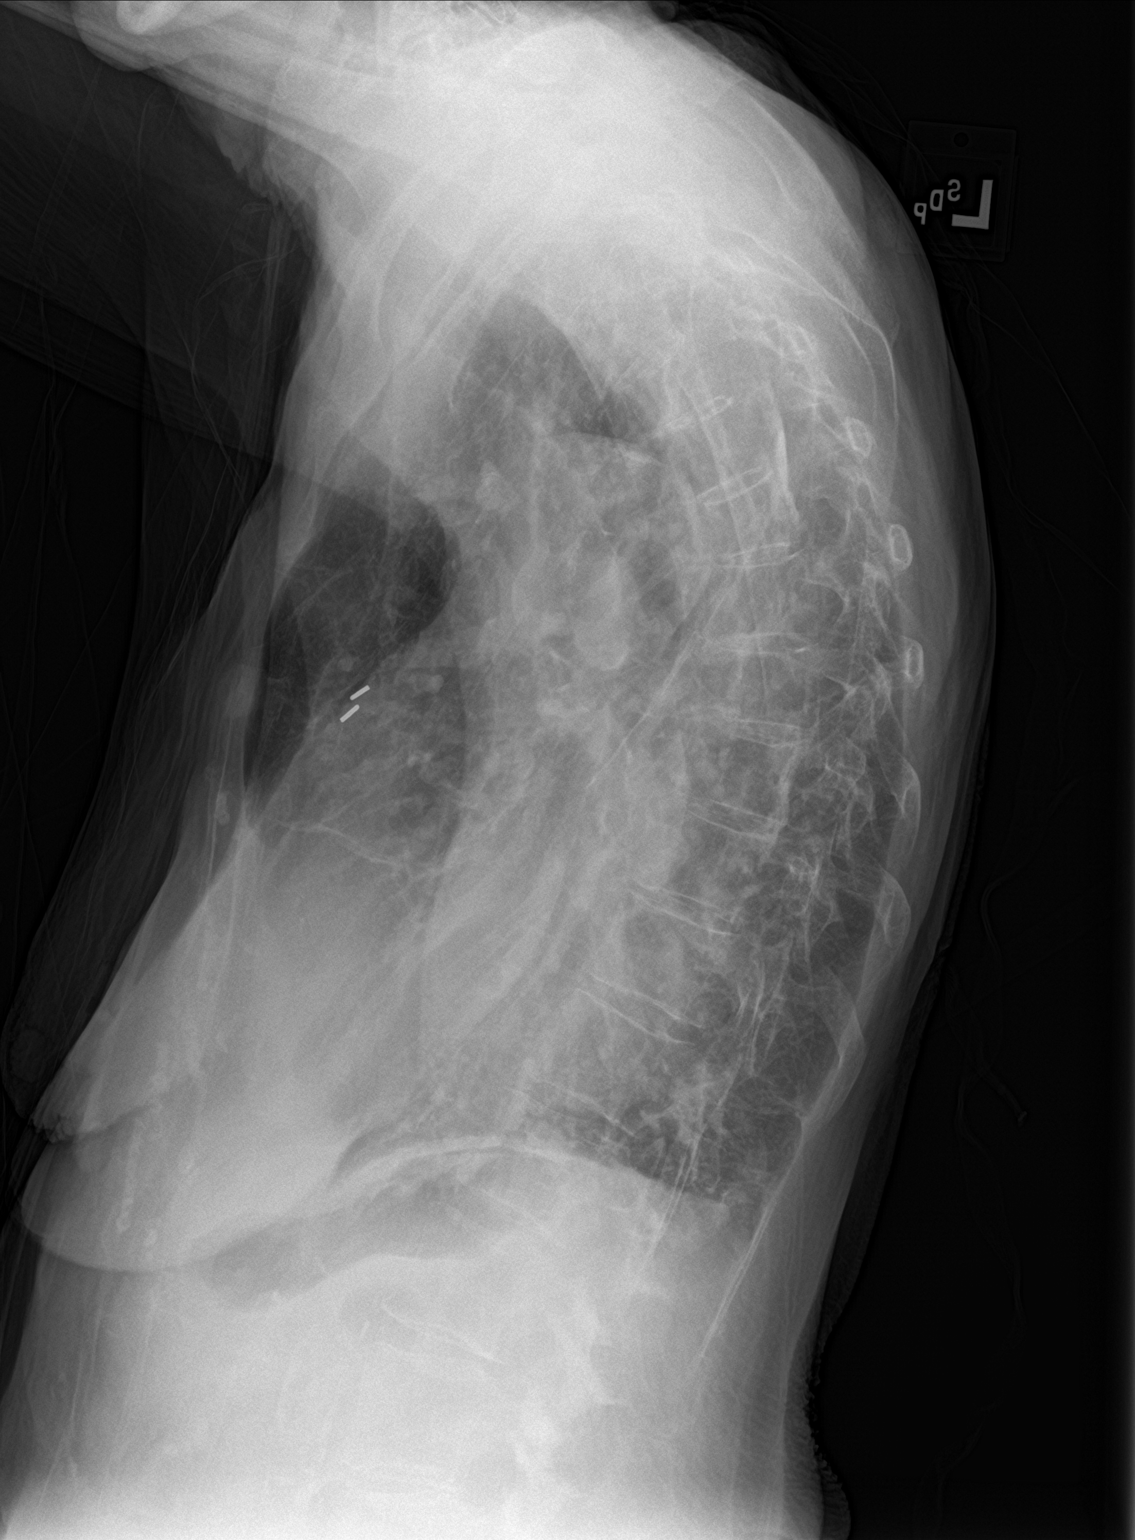

[2 of 2 positions shown; findings below may reference images not displayed]

FINDINGS: Stable changes of right pneumonectomy. Left upper lobe lung nodule
again noted, similar to prior study. Hyperinflation/COPD changes
within the left lung. Mild cardiomegaly. No left effusion or acute
bony abnormality.
IMPRESSION: Stable left upper lobe pulmonary nodule and changes of right
pneumonectomy.

COPD.

Mild cardiomegaly.

## 2019-01-04 DIAGNOSIS — J9611 Chronic respiratory failure with hypoxia: Secondary | ICD-10-CM | POA: Diagnosis not present

## 2019-01-04 DIAGNOSIS — E785 Hyperlipidemia, unspecified: Secondary | ICD-10-CM | POA: Diagnosis not present

## 2019-01-06 DIAGNOSIS — E785 Hyperlipidemia, unspecified: Secondary | ICD-10-CM | POA: Diagnosis not present

## 2019-01-06 DIAGNOSIS — J9611 Chronic respiratory failure with hypoxia: Secondary | ICD-10-CM | POA: Diagnosis not present

## 2019-01-12 DIAGNOSIS — J449 Chronic obstructive pulmonary disease, unspecified: Secondary | ICD-10-CM | POA: Diagnosis not present

## 2019-01-12 DIAGNOSIS — J432 Centrilobular emphysema: Secondary | ICD-10-CM | POA: Diagnosis not present

## 2019-01-12 DIAGNOSIS — J9611 Chronic respiratory failure with hypoxia: Secondary | ICD-10-CM | POA: Diagnosis not present

## 2019-01-12 DIAGNOSIS — R600 Localized edema: Secondary | ICD-10-CM | POA: Diagnosis not present

## 2019-01-12 DIAGNOSIS — G2581 Restless legs syndrome: Secondary | ICD-10-CM | POA: Diagnosis not present

## 2019-01-12 DIAGNOSIS — Z87891 Personal history of nicotine dependence: Secondary | ICD-10-CM | POA: Diagnosis not present

## 2019-02-25 DIAGNOSIS — E41 Nutritional marasmus: Secondary | ICD-10-CM | POA: Diagnosis not present

## 2019-02-25 DIAGNOSIS — G2581 Restless legs syndrome: Secondary | ICD-10-CM

## 2019-02-25 DIAGNOSIS — K219 Gastro-esophageal reflux disease without esophagitis: Secondary | ICD-10-CM

## 2019-02-25 DIAGNOSIS — R911 Solitary pulmonary nodule: Secondary | ICD-10-CM

## 2019-02-25 DIAGNOSIS — J439 Emphysema, unspecified: Secondary | ICD-10-CM

## 2019-02-25 DIAGNOSIS — F39 Unspecified mood [affective] disorder: Secondary | ICD-10-CM

## 2019-02-25 DIAGNOSIS — J9611 Chronic respiratory failure with hypoxia: Secondary | ICD-10-CM | POA: Diagnosis not present

## 2019-03-02 DIAGNOSIS — M21611 Bunion of right foot: Secondary | ICD-10-CM | POA: Diagnosis not present

## 2019-04-19 DEATH — deceased
